# Patient Record
Sex: Female | Born: 1995 | Race: Black or African American | Hispanic: No | Marital: Single | Smoking: Never smoker
Health system: Southern US, Community
[De-identification: ages and names within clinical notes are randomized; demographics above are authoritative.]

## PROBLEM LIST (undated history)

## (undated) DIAGNOSIS — Z789 Other specified health status: Secondary | ICD-10-CM

## (undated) DIAGNOSIS — A599 Trichomoniasis, unspecified: Principal | ICD-10-CM

## (undated) DIAGNOSIS — Z3490 Encounter for supervision of normal pregnancy, unspecified, unspecified trimester: Secondary | ICD-10-CM

## (undated) HISTORY — DX: Trichomoniasis, unspecified: A59.9

## (undated) HISTORY — DX: Other specified health status: Z78.9

## (undated) HISTORY — PX: NO PAST SURGERIES: SHX2092

## (undated) HISTORY — DX: Encounter for supervision of normal pregnancy, unspecified, unspecified trimester: Z34.90

---
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
## 2014-05-08 NOTE — L&D Delivery Note (Signed)
Delivery Note At 4:18 AM a viable female was delivered via  (Presentation: LOA;  ).  APGAR: 8,9 ; weight  .   Placenta status: intact .  Cord: 3v,  with the following complications: none.    Anesthesia: Epidural  Episiotomy:   Lacerations:  None Suture Repair: none Est. Blood Loss (mL):    Mom to postpartum.  Baby to Couplet care / Skin to Skin.  Called to delivery. Mother pushed over 1 hour. Infant delivered to maternal abdomen. Cord clamped and cut. Active management of 3rd stage with traction and Pitocin. Placenta delivered intact with 3v cord. No tears. [id number]. Counts correct. Hemostatic.   Susan Jollyaleb G Melancon, MD Family Medicine - PGY 2  04/05/2015, 4:30 AM

## 2014-08-31 ENCOUNTER — Encounter (HOSPITAL_COMMUNITY): Payer: Self-pay | Admitting: Cardiology

## 2014-08-31 ENCOUNTER — Emergency Department (HOSPITAL_COMMUNITY)
Admission: EM | Admit: 2014-08-31 | Discharge: 2014-08-31 | Disposition: A | Discharge status: Home / Self Care | Payer: Self-pay | Attending: Emergency Medicine | Admitting: Emergency Medicine

## 2014-08-31 DIAGNOSIS — M5412 Radiculopathy, cervical region: Secondary | ICD-10-CM | POA: Insufficient documentation

## 2014-08-31 MED ORDER — IBUPROFEN 800 MG PO TABS: 800.0000 mg | ORAL_TABLET | Freq: Three times a day (TID) | ORAL | Status: DC

## 2014-08-31 MED ORDER — PREDNISONE 20 MG PO TABS
ORAL_TABLET | ORAL | Status: DC
Start: 2014-08-31 — End: 2014-11-17

## 2014-08-31 MED ORDER — HYDROCODONE-ACETAMINOPHEN 5-325 MG PO TABS: 2.0000 | ORAL_TABLET | ORAL | Status: DC | PRN

## 2014-08-31 NOTE — ED Provider Notes (Signed)
CSN: [id number]     Arrival date & time 08/31/14  4782 History  This chart was scribed for Gilda Crease, MD by Richarda Overlie, ED Scribe. This patient was seen in room APA05/APA05 and the patient's care was started 9:23 AM.     Chief Complaint  Patient presents with  . Shoulder Pain   The history is provided by the patient. No language interpreter was used.    HPI Comments: Susan Atkinson is a 18 y.o. female with no medical history who presents to the Emergency Department complaining of constant, left shoulder pain for the last 4 days. Pt also complains of left sided neck pain and states that the pain radiates all the way towards her left fingertips. She denies any new injuries. Pt states that she has had left shoulder problems in the past due to sports injuries but states she has not experienced the pain radiating down her left arm until her current episode. She states that she has been experiencing a tingling sensation in her left hand as well. She states that she has received x-rays in the past which have been negative. Pt states that certain movements worsens her pain. Pt reports no symptoms on the right side.    History reviewed. No pertinent past medical history. History reviewed. No pertinent past surgical history. History reviewed. No pertinent family history. History  Substance Use Topics  . Smoking status: Never Smoker   . Smokeless tobacco: Not on file  . Alcohol Use: No   OB History    No data available     Review of Systems  Musculoskeletal: Positive for arthralgias.  All other systems reviewed and are negative.   Allergies  Review of patient's allergies indicates no known allergies.  Home Medications   Prior to Admission medications   Medication Sig Start Date End Date Taking? Authorizing Provider  HYDROcodone-acetaminophen (NORCO/VICODIN) 5-325 MG per tablet Take 2 tablets by mouth every 4 (four) hours as needed for moderate pain. 08/31/14   Gilda Crease, MD  ibuprofen (ADVIL,MOTRIN) 800 MG tablet Take 1 tablet (800 mg total) by mouth 3 (three) times daily. 08/31/14   Gilda Crease, MD  predniSONE (DELTASONE) 20 MG tablet 3 tabs po daily x 3 days, then 2 tabs x 3 days, then 1.5 tabs x 3 days, then 1 tab x 3 days, then 0.5 tabs x 3 days 08/31/14   Gilda Crease, MD   BP 122/63 mmHg  Pulse 76  Temp(Src) 98.4 F (36.9 C) (Oral)  Resp 16  Ht  (1.778 m)  Wt 160 lb (72.576 kg)  BMI 22.96 kg/m2  SpO2 100%  LMP 08/23/2014 Physical Exam  Constitutional: She is oriented to person, place, and time. She appears well-developed and well-nourished. No distress.  HENT:  Head: Normocephalic and atraumatic.  Right Ear: Hearing normal.  Left Ear: Hearing normal.  Nose: Nose normal.  Mouth/Throat: Oropharynx is clear and moist and mucous membranes are normal.  Eyes: Conjunctivae and EOM are normal. Pupils are equal, round, and reactive to light.  Neck: Normal range of motion. Neck supple.  Cardiovascular: Regular rhythm, S1 normal and S2 normal.  Exam reveals no gallop and no friction rub.   No murmur heard. Pulmonary/Chest: Effort normal and breath sounds normal. No respiratory distress. She exhibits no tenderness.  Abdominal: Soft. Normal appearance and bowel sounds are normal. There is no hepatosplenomegaly. There is no tenderness. There is no rebound, no guarding, no tenderness at McBurney's point and  negative Murphy's sign. No hernia.  Musculoskeletal: Normal range of motion. She exhibits tenderness.  Left parapsinal cervial tenderness. Diffuse shoulder tenderness to biceps tendon and subacromial area.   Neurological: She is alert and oriented to person, place, and time. She has normal strength. No cranial nerve deficit or sensory deficit. Coordination normal. GCS eye subscore is 4. GCS verbal subscore is 5. GCS motor subscore is 6.  Normal strength and sensation. Normal extension, flexion, abduction, adduction and  opposition of fingers.   Skin: Skin is warm, dry and intact. No rash noted. No cyanosis.  Psychiatric: She has a normal mood and affect. Her speech is normal and behavior is normal. Thought content normal.  Nursing note and vitals reviewed.   ED Course  Procedures   DIAGNOSTIC STUDIES: Oxygen Saturation is 100% on RA, normal by my interpretation.    COORDINATION OF CARE: 9:28 AM Discussed treatment plan with pt at bedside and pt agreed to plan.   Labs Review Labs Reviewed - No data to display  Imaging Review No results found.   EKG Interpretation None      MDM   Final diagnoses:  Cervical radiculopathy   Resents to the ER for evaluation of pain in the left shoulder radiating down the arm. There is some pain in the left paraspinal area of the neck, but not midline. This could be cervical radiculopathy, versus tendinitis/bursitis. Treat with prednisone, rest, analgesia. Follow-up with orthopedics.  I personally performed the services described in this documentation, which was scribed in my presence. The recorded information has been reviewed and is accurate.       Gilda Creasehristopher J Pollina, MD 08/31/14 [phone]

## 2014-08-31 NOTE — ED Notes (Signed)
Pt c/o neck and shoulder pain. Pt states pain has become much worse over the past 3-4 days.

## 2014-08-31 NOTE — Discharge Instructions (Signed)

## 2014-11-17 ENCOUNTER — Ambulatory Visit (INDEPENDENT_AMBULATORY_CARE_PROVIDER_SITE_OTHER): Payer: Medicaid Other | Admitting: Adult Health

## 2014-11-17 ENCOUNTER — Encounter: Payer: Self-pay | Admitting: Adult Health

## 2014-11-17 VITALS — BP 130/62 | HR 104 | Ht 67.5 in | Wt 182.0 lb

## 2014-11-17 DIAGNOSIS — Z3009 Encounter for other general counseling and advice on contraception: Secondary | ICD-10-CM

## 2014-11-17 DIAGNOSIS — Z3490 Encounter for supervision of normal pregnancy, unspecified, unspecified trimester: Secondary | ICD-10-CM

## 2014-11-17 DIAGNOSIS — Z309 Encounter for contraceptive management, unspecified: Secondary | ICD-10-CM | POA: Diagnosis not present

## 2014-11-17 DIAGNOSIS — Z363 Encounter for antenatal screening for malformations: Secondary | ICD-10-CM

## 2014-11-17 DIAGNOSIS — Z01419 Encounter for gynecological examination (general) (routine) without abnormal findings: Secondary | ICD-10-CM

## 2014-11-17 DIAGNOSIS — Z3201 Encounter for pregnancy test, result positive: Secondary | ICD-10-CM

## 2014-11-17 HISTORY — DX: Encounter for supervision of normal pregnancy, unspecified, unspecified trimester: Z34.90

## 2014-11-17 LAB — POCT URINE PREGNANCY: Preg Test, Ur: POSITIVE — AB

## 2014-11-17 MED ORDER — PRENATAL PLUS 27-1 MG PO TABS: 1.0000 | ORAL_TABLET | Freq: Every day | ORAL | Status: DC

## 2014-11-17 NOTE — Progress Notes (Signed)
Patient ID: Susan Atkinson, female   DOB: 10-Aug-1995, 19 y.o.   MRN: [id number] History of Present Illness: Susan Atkinson is a 19 year old black female in for well woman gyn exam, she has family planning medicaid, and had missed several periods, so will get UPT now and it is +.Has not felt movements, but feels tight knots at times.   Current Medications, Allergies, Past Medical History, Past Surgical History, Family History and Social History were reviewed in [name] electronic medical record.     Review of Systems: Patient denies any headaches, hearing loss, fatigue, blurred vision, shortness of breath, chest pain, abdominal pain, problems with bowel movements, urination, or intercourse. No joint pain or mood swings.+missed period.    Physical Exam:BP 130/62 mmHg  Pulse 104  Ht 5' 7.5" (1.715 m)  Wt 182 lb (82.555 kg)  BMI 28.07 kg/m2  LMP 08/04/2014 UPT+ about 15 weeks by LMP, EDD 05/11/15 but by exam about 20-21 weeks. General:  Well developed, well nourished, no acute distress Skin:  Warm and dry Neck:  Midline trachea, normal thyroid, good ROM, no lymphadenopathy Lungs; Clear to auscultation bilaterally Breast:  No dominant palpable mass, retraction, or nipple discharge Cardiovascular: Regular rate and rhythm Abdomen:  Soft, non tender, no hepatosplenomegaly, FH U+1, Has FHR 164 by doppler Pelvic:  External genitalia is normal in appearance, no lesions.  The vagina is normal in appearance. Urethra has no lesions or masses. The cervix is smooth and closed, GC/CHL obtained.  Uterus is felt to be about 20-21 weeks in size..  No adnexal masses or tenderness noted.Bladder is non tender, no masses felt. Extremities/musculoskeletal:  No swelling or varicosities noted, no clubbing or cyanosis Psych:  No mood changes, alert and cooperative,seems happy   Impression: Well woman gyn exam no pap, has Family planning medicaid Pregnant    Plan: GC/CHL sent Check HIV,RPR and HSV2 Rx  prenatal plus #30 take 1 daily with 11 refills Return in 1 week for anatomy scan and new OB visit Review handout on second trimester, new OB packet given  Confirmation form given, go get pregnancy medicaid

## 2014-11-17 NOTE — Patient Instructions (Signed)
Second Trimester of Pregnancy The second trimester is from week 13 through week 28, months 4 through 6. The second trimester is often a time when you feel your best. Your body has also adjusted to being pregnant, and you begin to feel better physically. Usually, morning sickness has lessened or quit completely, you may have more energy, and you may have an increase in appetite. The second trimester is also a time when the fetus is growing rapidly. At the end of the sixth month, the fetus is about 9 inches long and weighs about 1 pounds. You will likely begin to feel the baby move (quickening) between 18 and 20 weeks of the pregnancy. BODY CHANGES Your body goes through many changes during pregnancy. The changes vary from woman to woman.   Your weight will continue to increase. You will notice your lower abdomen bulging out.  You may begin to get stretch marks on your hips, abdomen, and breasts.  You may develop headaches that can be relieved by medicines approved by your health care provider.  You may urinate more often because the fetus is pressing on your bladder.  You may develop or continue to have heartburn as a result of your pregnancy.  You may develop constipation because certain hormones are causing the muscles that push waste through your intestines to slow down.  You may develop hemorrhoids or swollen, bulging veins (varicose veins).  You may have back pain because of the weight gain and pregnancy hormones relaxing your joints between the bones in your pelvis and as a result of a shift in weight and the muscles that support your balance.  Your breasts will continue to grow and be tender.  Your gums may bleed and may be sensitive to brushing and flossing.  Dark spots or blotches (chloasma, mask of pregnancy) may develop on your face. This will likely fade after the baby is born.  A dark line from your belly button to the pubic area (linea nigra) may appear. This will likely fade  after the baby is born.  You may have changes in your hair. These can include thickening of your hair, rapid growth, and changes in texture. Some women also have hair loss during or after pregnancy, or hair that feels dry or thin. Your hair will most likely return to normal after your baby is born. WHAT TO EXPECT AT YOUR PRENATAL VISITS During a routine prenatal visit:  You will be weighed to make sure you and the fetus are growing normally.  Your blood pressure will be taken.  Your abdomen will be measured to track your baby's growth.  The fetal heartbeat will be listened to.  Any test results from the previous visit will be discussed. Your health care provider may ask you:  How you are feeling.  If you are feeling the baby move.  If you have had any abnormal symptoms, such as leaking fluid, bleeding, severe headaches, or abdominal cramping.  If you have any questions. Other tests that may be performed during your second trimester include:  Blood tests that check for:  Low iron levels (anemia).  Gestational diabetes (between 24 and 28 weeks).  Rh antibodies.  Urine tests to check for infections, diabetes, or protein in the urine.  An ultrasound to confirm the proper growth and development of the baby.  An amniocentesis to check for possible genetic problems.  Fetal screens for spina bifida and Down syndrome. HOME CARE INSTRUCTIONS   Avoid all smoking, herbs, alcohol, and unprescribed   drugs. These chemicals affect the formation and growth of the baby.  Follow your health care provider's instructions regarding medicine use. There are medicines that are either safe or unsafe to take during pregnancy.  Exercise only as directed by your health care provider. Experiencing uterine cramps is a good sign to stop exercising.  Continue to eat regular, healthy meals.  Wear a good support bra for breast tenderness.  Do not use hot tubs, steam rooms, or saunas.  Wear your  seat belt at all times when driving.  Avoid raw meat, uncooked cheese, cat litter boxes, and soil used by cats. These carry germs that can cause birth defects in the baby.  Take your prenatal vitamins.  Try taking a stool softener (if your health care provider approves) if you develop constipation. Eat more high-fiber foods, such as fresh vegetables or fruit and whole grains. Drink plenty of fluids to keep your urine clear or pale yellow.  Take warm sitz baths to soothe any pain or discomfort caused by hemorrhoids. Use hemorrhoid cream if your health care provider approves.  If you develop varicose veins, wear support hose. Elevate your feet for 15 minutes, 3-4 times a day. Limit salt in your diet.  Avoid heavy lifting, wear low heel shoes, and practice good posture.  Rest with your legs elevated if you have leg cramps or low back pain.  Visit your dentist if you have not gone yet during your pregnancy. Use a soft toothbrush to brush your teeth and be gentle when you floss.  A sexual relationship may be continued unless your health care provider directs you otherwise.  Continue to go to all your prenatal visits as directed by your health care provider. SEEK MEDICAL CARE IF:   You have dizziness.  You have mild pelvic cramps, pelvic pressure, or nagging pain in the abdominal area.  You have persistent nausea, vomiting, or diarrhea.  You have a bad smelling vaginal discharge.  You have pain with urination. SEEK IMMEDIATE MEDICAL CARE IF:   You have a fever.  You are leaking fluid from your vagina.  You have spotting or bleeding from your vagina.  You have severe abdominal cramping or pain.  You have rapid weight gain or loss.  You have shortness of breath with chest pain.  You notice sudden or extreme swelling of your face, hands, ankles, feet, or legs.  You have not felt your baby move in over an hour.  You have severe headaches that do not go away with  medicine.  You have vision changes. Document Released: 04/18/2001 Document Revised: 04/29/2013 Document Reviewed: 06/25/2012 [facility] Patient Information 2015 [place]. This information is not intended to replace advice given to you by your health care provider. Make sure you discuss any questions you have with your health care provider. Return in 1 week for [place]

## 2014-11-19 LAB — HIV ANTIBODY (ROUTINE TESTING W REFLEX): HIV Screen 4th Generation wRfx: NONREACTIVE

## 2014-11-19 LAB — HSV 2 ANTIBODY, IGG: HSV 2 Glycoprotein G Ab, IgG: <0.91 index (ref 0.00–0.90)

## 2014-11-19 LAB — GC/CHLAMYDIA PROBE AMP
Chlamydia trachomatis, NAA: NEGATIVE
NEISSERIA GONORRHOEAE BY PCR: NEGATIVE

## 2014-11-19 LAB — RPR: RPR Ser Ql: NONREACTIVE

## 2014-11-26 ENCOUNTER — Encounter: Payer: Self-pay | Admitting: Advanced Practice Midwife

## 2014-11-26 ENCOUNTER — Ambulatory Visit (INDEPENDENT_AMBULATORY_CARE_PROVIDER_SITE_OTHER): Payer: Medicaid Other | Admitting: Advanced Practice Midwife

## 2014-11-26 VITALS — BP 118/64 | HR 87 | Wt 184.0 lb

## 2014-11-26 DIAGNOSIS — O0932 Supervision of pregnancy with insufficient antenatal care, second trimester: Secondary | ICD-10-CM | POA: Diagnosis not present

## 2014-11-26 DIAGNOSIS — Z0283 Encounter for blood-alcohol and blood-drug test: Secondary | ICD-10-CM

## 2014-11-26 DIAGNOSIS — Z1389 Encounter for screening for other disorder: Secondary | ICD-10-CM

## 2014-11-26 DIAGNOSIS — Z3402 Encounter for supervision of normal first pregnancy, second trimester: Secondary | ICD-10-CM | POA: Diagnosis not present

## 2014-11-26 DIAGNOSIS — Z3490 Encounter for supervision of normal pregnancy, unspecified, unspecified trimester: Secondary | ICD-10-CM

## 2014-11-26 DIAGNOSIS — Z331 Pregnant state, incidental: Secondary | ICD-10-CM

## 2014-11-26 DIAGNOSIS — Z369 Encounter for antenatal screening, unspecified: Secondary | ICD-10-CM

## 2014-11-26 NOTE — Patient Instructions (Signed)
1. Before your test, do not eat or drink anything for 8-10 hours prior to your  appointment (a small amount of water is allowed and you may take any medicines you normally take). Be sure to drink lots of water the day before. 2. When you arrive, your blood will be drawn for a 'fasting' blood sugar level.  Then you will be given a sweetened carbonated beverage to drink. You should  complete drinking this beverage within five minutes. After finishing the  beverage, you will have your blood drawn exactly 1 and 2 hours later. Having  your blood drawn on time is an important part of this test. A total of three blood  samples will be done. 3. The test takes approximately 2  hours. During the test, do not have anything to  eat or drink. Do not smoke, chew gum (not even sugarless gum) or use breath mints.  4. During the test you should remain close by and seated as much as possible and  avoid walking around. You may want to bring a book or something else to  occupy your time.  5. After your test, you may eat and drink as normal. You may want to bring a snack  to eat after the test is finished. Your provider will advise you as to the results of  this test and any follow-up if necessary  You will also be retested for syphilis, HIV and blood levels (anemia):  You were already tested in the first trimester, but [facility] recommends retesting.  Additionally, you will be tested for Type 2 Herpes. MOST people do not know that they have genital herpes, as only around 15% of people have outbreaks.  However, it is still transmittable to other people, including the baby (but only during the birth).  If you test positive for Type 2 Herpes, we place you on a medicine called acyclovir the last 6 weeks of your pregnancy to prevent transmission of the virus to the baby during the birth.    If your sugar test is positive for gestational diabetes, you will be given an phone call and further instructions discussed.   We typically do not call patients with positive herpes results, but will discuss it at your next appointment.  If you wish to know all of your test results before your next appointment, feel free to call the office, or look up your test results on Mychart.  (The range that the lab uses for normal values of the sugar test are not necessarily the range that is used for pregnant women; if your results are within the range, they are definitely normal.  However, if a value is deemed "high" by the lab, it may not be too high for a pregnant woman.  We will need to discuss the normal range if your value(s) fall in the "high" category).     Tdap Vaccine  It is recommended that you get the Tdap vaccine during the third trimester of EACH pregnancy to help protect your baby from getting pertussis (whooping cough)  27-36 weeks is the BEST time to do this so that you can pass the protection on to your baby. During pregnancy is better than after pregnancy, but if you are unable to get it during pregnancy it will be offered at the hospital.  You can get this vaccine at the health department or your family doctor, as well as some pharmacies.  Everyone who will be around your baby should also be up-to-date on   their vaccines. Adults (who are not pregnant) only need 1 dose of Tdap during adulthood.     Second Trimester of Pregnancy The second trimester is from week 13 through week 28, months 4 through 6. The second trimester is often a time when you feel your best. Your body has also adjusted to being pregnant, and you begin to feel better physically. Usually, morning sickness has lessened or quit completely, you may have more energy, and you may have an increase in appetite. The second trimester is also a time when the fetus is growing rapidly. At the end of the sixth month, the fetus is about 9 inches long and weighs about 1 pounds. You will likely begin to feel the baby move (quickening) between 18 and 20 weeks of  the pregnancy. BODY CHANGES Your body goes through many changes during pregnancy. The changes vary from woman to woman.   Your weight will continue to increase. You will notice your lower abdomen bulging out.  You may begin to get stretch marks on your hips, abdomen, and breasts.  You may develop headaches that can be relieved by medicines approved by your health care provider.  You may urinate more often because the fetus is pressing on your bladder.  You may develop or continue to have heartburn as a result of your pregnancy.  You may develop constipation because certain hormones are causing the muscles that push waste through your intestines to slow down.  You may develop hemorrhoids or swollen, bulging veins (varicose veins).  You may have back pain because of the weight gain and pregnancy hormones relaxing your joints between the bones in your pelvis and as a result of a shift in weight and the muscles that support your balance.  Your breasts will continue to grow and be tender.  Your gums may bleed and may be sensitive to brushing and flossing.  Dark spots or blotches (chloasma, mask of pregnancy) may develop on your face. This will likely fade after the baby is born.  A dark line from your belly button to the pubic area (linea nigra) may appear. This will likely fade after the baby is born.  You may have changes in your hair. These can include thickening of your hair, rapid growth, and changes in texture. Some women also have hair loss during or after pregnancy, or hair that feels dry or thin. Your hair will most likely return to normal after your baby is born. WHAT TO EXPECT AT YOUR PRENATAL VISITS During a routine prenatal visit:  You will be weighed to make sure you and the fetus are growing normally.  Your blood pressure will be taken.  Your abdomen will be measured to track your baby's growth.  The fetal heartbeat will be listened to.  Any test results from the  previous visit will be discussed. Your health care provider may ask you:  How you are feeling.  If you are feeling the baby move.  If you have had any abnormal symptoms, such as leaking fluid, bleeding, severe headaches, or abdominal cramping.  If you have any questions. Other tests that may be performed during your second trimester include:  Blood tests that check for:  Low iron levels (anemia).  Gestational diabetes (between 24 and 28 weeks).  Rh antibodies.  Urine tests to check for infections, diabetes, or protein in the urine.  An ultrasound to confirm the proper growth and development of the baby.  An amniocentesis to check for possible genetic problems.  Fetal screens for spina bifida and Down syndrome. HOME CARE INSTRUCTIONS   Avoid all smoking, herbs, alcohol, and unprescribed drugs. These chemicals affect the formation and growth of the baby.  Follow your health care provider's instructions regarding medicine use. There are medicines that are either safe or unsafe to take during pregnancy.  Exercise only as directed by your health care provider. Experiencing uterine cramps is a good sign to stop exercising.  Continue to eat regular, healthy meals.  Wear a good support bra for breast tenderness.  Do not use hot tubs, steam rooms, or saunas.  Wear your seat belt at all times when driving.  Avoid raw meat, uncooked cheese, cat litter boxes, and soil used by cats. These carry germs that can cause birth defects in the baby.  Take your prenatal vitamins.  Try taking a stool softener (if your health care provider approves) if you develop constipation. Eat more high-fiber foods, such as fresh vegetables or fruit and whole grains. Drink plenty of fluids to keep your urine clear or pale yellow.  Take warm sitz baths to soothe any pain or discomfort caused by hemorrhoids. Use hemorrhoid cream if your health care provider approves.  If you develop varicose veins, wear  support hose. Elevate your feet for 15 minutes, 3-4 times a day. Limit salt in your diet.  Avoid heavy lifting, wear low heel shoes, and practice good posture.  Rest with your legs elevated if you have leg cramps or low back pain.  Visit your dentist if you have not gone yet during your pregnancy. Use a soft toothbrush to brush your teeth and be gentle when you floss.  A sexual relationship may be continued unless your health care provider directs you otherwise.  Continue to go to all your prenatal visits as directed by your health care provider. SEEK MEDICAL CARE IF:   You have dizziness.  You have mild pelvic cramps, pelvic pressure, or nagging pain in the abdominal area.  You have persistent nausea, vomiting, or diarrhea.  You have a bad smelling vaginal discharge.  You have pain with urination. SEEK IMMEDIATE MEDICAL CARE IF:   You have a fever.  You are leaking fluid from your vagina.  You have spotting or bleeding from your vagina.  You have severe abdominal cramping or pain.  You have rapid weight gain or loss.  You have shortness of breath with chest pain.  You notice sudden or extreme swelling of your face, hands, ankles, feet, or legs.  You have not felt your baby move in over an hour.  You have severe headaches that do not go away with medicine.  You have vision changes. Document Released: 04/18/2001 Document Revised: 04/29/2013 Document Reviewed: 06/25/2012 [facility] Patient Information 2015 [place], [place]. This information is not intended to replace advice given to you by your health care provider. Make sure you discuss any questions you have with your health care provider.

## 2014-11-26 NOTE — Progress Notes (Signed)
   Subjective:    Susan Atkinson is a G1P0 Unknown being seen today for her first obstetrical visit. She is 16 weeks by "sure LMP", dating [place] scheduled for tomorrow.   Patient reports no complaints.  Filed Vitals:   11/26/14 1142  BP: 118/64  Pulse: 87  Weight: 184 lb (83.462 kg)    HISTORY: OB History  Gravida Para Term Preterm AB SAB TAB Ectopic Multiple Living  1             # Outcome Date GA Lbr Len/2nd Weight Sex Delivery Anes PTL Lv  1 Current              Past Medical History  Diagnosis Date  . Pregnant 11/17/2014   History reviewed. No pertinent past surgical history. History reviewed. No pertinent family history.   Exam                       Uterus Normal, Gravid, FH: 26                System:     Skin: normal coloration and turgor, no rashes    Neurologic: oriented, normal, normal mood   Extremities: normal strength, tone, and muscle mass   HEENT PERRLA   Mouth/Teeth mucous membranes moist, normal dentition   Neck supple and no masses   Cardiovascular: regular rate and rhythm   Respiratory:  appears well, vitals normal, no respiratory distress, acyanotic   Abdomen: soft, non-tender;  FHR: 150          Assessment:    Pregnancy: G1P0 Patient Active Problem List   Diagnosis Date Noted  . Pregnant 11/17/2014        Plan:     Initial labs:  Ordered, but will wait until tomorrow in case genetic screening can be drawn Continue prenatal vitamins  Problem list reviewed and updated  Reviewed recommended weight gain based on pre-gravid BMI  Encouraged well-balanced diet Genetic Screening discussed Quad Screen: requested.  Ultrasound discussed; fetal survey: requested.  Follow up tomorrow for anatomy/dating [place].  Assuming that she is ~26 weeks, will f/u 3 weeks for PN2.  Marland Kitchen  CRESENZO-DISHMAN,Charrisse Masley 11/26/2014

## 2014-11-27 ENCOUNTER — Ambulatory Visit (INDEPENDENT_AMBULATORY_CARE_PROVIDER_SITE_OTHER): Payer: Medicaid Other

## 2014-11-27 DIAGNOSIS — Z363 Encounter for antenatal screening for malformations: Secondary | ICD-10-CM

## 2014-11-27 DIAGNOSIS — Z36 Encounter for antenatal screening of mother: Secondary | ICD-10-CM

## 2014-11-27 NOTE — Progress Notes (Signed)
[place] 21+6wks,efw 467g,normal ov's bilat,sdp of fluid 6.1cm,post pl gr 0,cx 3.6cm,breech,fht 145bpm,anatomy complete w/no obvious abn seen,

## 2014-11-28 LAB — GC/CHLAMYDIA PROBE AMP
Chlamydia trachomatis, NAA: NEGATIVE
Neisseria gonorrhoeae by PCR: NEGATIVE

## 2014-11-28 LAB — URINE CULTURE: ORGANISM ID, BACTERIA: NO GROWTH

## 2014-11-30 LAB — MICROSCOPIC EXAMINATION: Casts: NONE SEEN /lpf

## 2014-11-30 LAB — HEPATITIS B SURFACE ANTIGEN: Hepatitis B Surface Ag: NEGATIVE

## 2014-11-30 LAB — CBC
HEMOGLOBIN: 12.2 g/dL (ref 11.1–15.9)
Hematocrit: 35 % (ref 34.0–46.6)
MCH: 30.4 pg (ref 26.6–33.0)
MCHC: 34.9 g/dL (ref 31.5–35.7)
MCV: 87 fL (ref 79–97)
PLATELETS: 198 10*3/uL (ref 150–379)
RBC: 4.01 x10E6/uL (ref 3.77–5.28)
RDW: 13.3 % (ref 12.3–15.4)
WBC: 9.6 10*3/uL (ref 3.4–10.8)

## 2014-11-30 LAB — VARICELLA ZOSTER ANTIBODY, IGG: Varicella zoster IgG: 1750 index (ref >165)

## 2014-11-30 LAB — URINALYSIS, ROUTINE W REFLEX MICROSCOPIC
Bilirubin, UA: NEGATIVE
GLUCOSE, UA: NEGATIVE
Ketones, UA: NEGATIVE
NITRITE UA: NEGATIVE
Protein, UA: NEGATIVE
RBC, UA: NEGATIVE
SPEC GRAV UA: 1.009 (ref 1.005–1.030)
Urobilinogen, Ur: 0.2 mg/dL (ref 0.2–1.0)
pH, UA: 8 — ABNORMAL HIGH (ref 5.0–7.5)

## 2014-11-30 LAB — PMP SCREEN PROFILE (10S), URINE
Amphetamine Screen, Ur: NEGATIVE ng/mL
BENZODIAZEPINE SCREEN, URINE: NEGATIVE ng/mL
Barbiturate Screen, Ur: NEGATIVE ng/mL
CANNABINOIDS UR QL SCN: NEGATIVE ng/mL
COCAINE(METAB.) SCREEN, URINE: NEGATIVE ng/mL
Creatinine(Crt), U: 20.6 mg/dL (ref 20.0–300.0)
Methadone Scn, Ur: NEGATIVE ng/mL
Opiate Scrn, Ur: NEGATIVE ng/mL
Oxycodone+Oxymorphone Ur Ql Scn: NEGATIVE ng/mL
PCP Scrn, Ur: NEGATIVE ng/mL
Ph of Urine: 7.9 (ref 4.5–8.9)
Propoxyphene, Screen: NEGATIVE ng/mL

## 2014-11-30 LAB — ABO/RH: Rh Factor: POSITIVE

## 2014-11-30 LAB — HIV ANTIBODY (ROUTINE TESTING W REFLEX): HIV SCREEN 4TH GENERATION: NONREACTIVE

## 2014-11-30 LAB — ANTIBODY SCREEN: Antibody Screen: NEGATIVE

## 2014-11-30 LAB — RPR: RPR: NONREACTIVE

## 2014-11-30 LAB — SICKLE CELL SCREEN: Sickle Cell Screen: NEGATIVE

## 2014-11-30 LAB — RUBELLA SCREEN: RUBELLA: 5.76 {index} (ref >0.99)

## 2014-12-04 LAB — CYSTIC FIBROSIS MUTATION 97: Interpretation: NOT DETECTED

## 2014-12-17 ENCOUNTER — Encounter: Payer: Self-pay | Admitting: Obstetrics and Gynecology

## 2014-12-17 ENCOUNTER — Other Ambulatory Visit: Payer: Medicaid Other

## 2014-12-17 ENCOUNTER — Ambulatory Visit (INDEPENDENT_AMBULATORY_CARE_PROVIDER_SITE_OTHER): Payer: Medicaid Other | Admitting: Obstetrics and Gynecology

## 2014-12-17 VITALS — BP 120/80 | HR 84 | Wt 187.0 lb

## 2014-12-17 DIAGNOSIS — Z1389 Encounter for screening for other disorder: Secondary | ICD-10-CM

## 2014-12-17 DIAGNOSIS — Z331 Pregnant state, incidental: Secondary | ICD-10-CM

## 2014-12-17 DIAGNOSIS — Z3402 Encounter for supervision of normal first pregnancy, second trimester: Secondary | ICD-10-CM

## 2014-12-17 LAB — POCT URINALYSIS DIPSTICK
Glucose, UA: NEGATIVE
Ketones, UA: NEGATIVE
Leukocytes, UA: NEGATIVE
NITRITE UA: NEGATIVE
Protein, UA: NEGATIVE
RBC UA: NEGATIVE

## 2014-12-17 NOTE — Progress Notes (Signed)
Pt denies any problems or concerns at this time.  

## 2014-12-17 NOTE — Progress Notes (Signed)
Patient ID: [name], female   DOB: 1995/09/14, 19 y.o.   MRN: [id number]  G1P0 [redacted]w[redacted]d Estimated Date of Delivery: 04/03/15  Blood pressure 120/80, pulse 84, weight 187 lb (84.823 kg), last menstrual period 08/04/2014.   refer to the ob flow sheet for FH and FHR, also BP, Wt, Urine results:notable for negative  Patient reports  + good fetal movement, denies any bleeding and no rupture of membranes symptoms or regular contractions. Patient complaints:Pt denies any complaints at this time.  FH- 27 cm FHR-156 bpm  Questions were answered. Assessment: [redacted]w[redacted]d, G1P0  Plan:  Continued routine obstetrical care.  F/u in 4 weeks for routine pre-natal care, glucose tolerance test pn2 labs   This chart was scribed for Tilda Burrow, MD by Gwenyth Ober, Medical Scribe. This patient was seen in room 4 and the patient's care was started at 11:19 AM.   I personally performed the services described in this documentation, which was SCRIBED in my presence. The recorded information has been reviewed and considered accurate. It has been edited as necessary during review. Tilda Burrow, MD

## 2015-01-14 ENCOUNTER — Ambulatory Visit (INDEPENDENT_AMBULATORY_CARE_PROVIDER_SITE_OTHER): Payer: Medicaid Other | Admitting: Advanced Practice Midwife

## 2015-01-14 ENCOUNTER — Other Ambulatory Visit: Payer: Medicaid Other

## 2015-01-14 ENCOUNTER — Encounter: Payer: Self-pay | Admitting: Advanced Practice Midwife

## 2015-01-14 VITALS — BP 116/76 | HR 84 | Wt 193.0 lb

## 2015-01-14 DIAGNOSIS — Z369 Encounter for antenatal screening, unspecified: Secondary | ICD-10-CM

## 2015-01-14 DIAGNOSIS — Z331 Pregnant state, incidental: Secondary | ICD-10-CM

## 2015-01-14 DIAGNOSIS — Z1389 Encounter for screening for other disorder: Secondary | ICD-10-CM

## 2015-01-14 DIAGNOSIS — Z131 Encounter for screening for diabetes mellitus: Secondary | ICD-10-CM

## 2015-01-14 DIAGNOSIS — Z3403 Encounter for supervision of normal first pregnancy, third trimester: Secondary | ICD-10-CM

## 2015-01-14 LAB — POCT URINALYSIS DIPSTICK
Glucose, UA: NEGATIVE
Glucose, UA: NEGATIVE
KETONES UA: NEGATIVE
Leukocytes, UA: NEGATIVE
Nitrite, UA: NEGATIVE
PROTEIN UA: NEGATIVE
RBC UA: NEGATIVE

## 2015-01-14 NOTE — Progress Notes (Signed)
G1P0 [redacted]w[redacted]d Estimated Date of Delivery: 04/03/15  Blood pressure 116/76, pulse 84, weight 193 lb (87.544 kg), last menstrual period 08/04/2014.   BP weight and urine results all reviewed and noted.  Please refer to the obstetrical flow sheet for the fundal height and fetal heart rate documentation:  Patient reports good fetal movement, denies any bleeding and no rupture of membranes symptoms or regular contractions. Patient is without complaints. All questions were answered.  Orders Placed This Encounter  Procedures  . POCT urinalysis dipstick    Plan:  Continued routine obstetrical care, PN2 today   Return in about 3 weeks (around 02/04/2015) for LROB.

## 2015-01-14 NOTE — Progress Notes (Signed)
Pt denies any problems or concerns at this time.  

## 2015-01-15 LAB — RPR: RPR Ser Ql: NONREACTIVE

## 2015-01-15 LAB — CBC
HEMATOCRIT: 35.9 % (ref 34.0–46.6)
Hemoglobin: 12.3 g/dL (ref 11.1–15.9)
MCH: 30.2 pg (ref 26.6–33.0)
MCHC: 34.3 g/dL (ref 31.5–35.7)
MCV: 88 fL (ref 79–97)
PLATELETS: 185 10*3/uL (ref 150–379)
RBC: 4.07 x10E6/uL (ref 3.77–5.28)
RDW: 12.7 % (ref 12.3–15.4)
WBC: 8 10*3/uL (ref 3.4–10.8)

## 2015-01-15 LAB — GLUCOSE TOLERANCE, 2 HOURS W/ 1HR
GLUCOSE, FASTING: 79 mg/dL (ref 65–91)
Glucose, 1 hour: 100 mg/dL (ref 65–179)
Glucose, 2 hour: 68 mg/dL (ref 65–152)

## 2015-01-15 LAB — ANTIBODY SCREEN: ANTIBODY SCREEN: NEGATIVE

## 2015-01-15 LAB — HSV 2 ANTIBODY, IGG

## 2015-01-15 LAB — HIV ANTIBODY (ROUTINE TESTING W REFLEX): HIV Screen 4th Generation wRfx: NONREACTIVE

## 2015-02-04 ENCOUNTER — Encounter: Payer: Self-pay | Admitting: Advanced Practice Midwife

## 2015-02-04 ENCOUNTER — Ambulatory Visit (INDEPENDENT_AMBULATORY_CARE_PROVIDER_SITE_OTHER): Payer: Medicaid Other | Admitting: Advanced Practice Midwife

## 2015-02-04 VITALS — BP 120/76 | HR 96 | Wt 196.0 lb

## 2015-02-04 DIAGNOSIS — O0932 Supervision of pregnancy with insufficient antenatal care, second trimester: Secondary | ICD-10-CM

## 2015-02-04 DIAGNOSIS — Z331 Pregnant state, incidental: Secondary | ICD-10-CM

## 2015-02-04 DIAGNOSIS — Z1389 Encounter for screening for other disorder: Secondary | ICD-10-CM

## 2015-02-04 DIAGNOSIS — Z3493 Encounter for supervision of normal pregnancy, unspecified, third trimester: Secondary | ICD-10-CM

## 2015-02-04 DIAGNOSIS — Z23 Encounter for immunization: Secondary | ICD-10-CM | POA: Diagnosis not present

## 2015-02-04 LAB — POCT URINALYSIS DIPSTICK
Blood, UA: 1
GLUCOSE UA: NEGATIVE
KETONES UA: NEGATIVE
LEUKOCYTES UA: NEGATIVE
Nitrite, UA: NEGATIVE

## 2015-02-04 NOTE — Progress Notes (Signed)
G1P0 [redacted]w[redacted]d Estimated Date of Delivery: 04/03/15  Last menstrual period 08/04/2014.   BP weight and urine results all reviewed and noted.  Please refer to the obstetrical flow sheet for the fundal height and fetal heart rate documentation:  Patient reports good fetal movement, denies any bleeding and no rupture of membranes symptoms or regular contractions. Patient is without complaints. All questions were answered.  No orders of the defined types were placed in this encounter.    Plan:  Continued routine obstetrical care,   Return in about 2 weeks (around 02/18/2015) for LROB.

## 2015-02-04 NOTE — Progress Notes (Signed)
Pt denies any problems or concerns at this time.  

## 2015-02-18 ENCOUNTER — Encounter: Payer: Self-pay | Admitting: Advanced Practice Midwife

## 2015-02-18 ENCOUNTER — Ambulatory Visit (INDEPENDENT_AMBULATORY_CARE_PROVIDER_SITE_OTHER): Payer: Medicaid Other | Admitting: Advanced Practice Midwife

## 2015-02-18 VITALS — BP 122/80 | HR 88 | Wt 201.0 lb

## 2015-02-18 DIAGNOSIS — Z331 Pregnant state, incidental: Secondary | ICD-10-CM

## 2015-02-18 DIAGNOSIS — Z3493 Encounter for supervision of normal pregnancy, unspecified, third trimester: Secondary | ICD-10-CM

## 2015-02-18 DIAGNOSIS — Z832 Family history of diseases of the blood and blood-forming organs and certain disorders involving the immune mechanism: Secondary | ICD-10-CM

## 2015-02-18 DIAGNOSIS — Z1389 Encounter for screening for other disorder: Secondary | ICD-10-CM

## 2015-02-18 LAB — POCT URINALYSIS DIPSTICK
Blood, UA: NEGATIVE
GLUCOSE UA: NEGATIVE
Ketones, UA: NEGATIVE
Leukocytes, UA: NEGATIVE
NITRITE UA: NEGATIVE

## 2015-02-18 NOTE — Patient Instructions (Signed)

## 2015-02-18 NOTE — Progress Notes (Signed)
G1P0 158w5d Estimated Date of Delivery: 04/03/15  Blood pressure 122/80, pulse 88, weight 201 lb (91.173 kg), last menstrual period 08/04/2014.   BP weight and urine results all reviewed and noted.  Please refer to the obstetrical flow sheet for the fundal height and fetal heart rate documentation:  Patient reports good fetal movement, denies any bleeding and no rupture of membranes symptoms or regular contractions. Patient is without complaints. All questions were answered.  Orders Placed This Encounter  Procedures  . POCT urinalysis dipstick    Plan:  Continued routine obstetrical care,   Return in about 2 weeks (around 03/04/2015) for LROB.

## 2015-02-18 NOTE — Progress Notes (Signed)
Pt denies any problems or concerns at this time.  

## 2015-02-25 ENCOUNTER — Encounter: Payer: Self-pay | Admitting: Obstetrics and Gynecology

## 2015-02-25 ENCOUNTER — Ambulatory Visit (INDEPENDENT_AMBULATORY_CARE_PROVIDER_SITE_OTHER): Payer: Medicaid Other | Admitting: Obstetrics and Gynecology

## 2015-02-25 ENCOUNTER — Telehealth: Payer: Self-pay | Admitting: Advanced Practice Midwife

## 2015-02-25 VITALS — BP 120/80 | HR 80 | Wt 199.0 lb

## 2015-02-25 DIAGNOSIS — Z331 Pregnant state, incidental: Secondary | ICD-10-CM

## 2015-02-25 DIAGNOSIS — Z1389 Encounter for screening for other disorder: Secondary | ICD-10-CM

## 2015-02-25 DIAGNOSIS — Z3493 Encounter for supervision of normal pregnancy, unspecified, third trimester: Secondary | ICD-10-CM

## 2015-02-25 LAB — POCT URINALYSIS DIPSTICK
GLUCOSE UA: NEGATIVE
Ketones, UA: NEGATIVE
LEUKOCYTES UA: NEGATIVE
NITRITE UA: NEGATIVE
RBC UA: NEGATIVE

## 2015-02-25 NOTE — Telephone Encounter (Signed)
Pt c/o left arm tingling and burning sensation, swelling left hand. Pt given an appt for today for evaluation.

## 2015-02-25 NOTE — Progress Notes (Signed)
Patient ID: Susan Atkinson, female   DOB: [birth year], 19 y.o.   MRN: [id number] WORK-IN APPOINTMENT G1P0 4337w5d Estimated Date of Delivery: 04/03/15  Blood pressure 120/80, pulse 80, weight 199 lb (90.266 kg), last menstrual period 08/04/2014.   refer to the ob flow sheet for FH and FHR, also BP, Wt, Urine results:notable for trace protein, otherwise negative  Patient reports  + good fetal movement, denies any bleeding and no rupture of membranes symptoms or regular contractions. Patient complaints: Patient WORKED IN for tingling, burning right arm pain that began last night. Pt states that it has improved somewhat, but the pain sets in again when she lays down. This is a problem. She denies headache or blurred vision.  FHR: 138 bpm FH: 36 cm  Questions were answered. Assessment: Pregnancy 6537w5d, LROB   Carpal tunnel syndrome, otherwise normal pregnancy   Encouraged patient and [facility]OB [facility].    Plan:  Continued routine obstetrical care,   Position changes discussed for bedtime - body pillow, etc.  F/u in 2 weeks for pnx care    By signing my name below, I, Susan Atkinson, attest that this documentation has been prepared under the direction and in the presence of Susan BurrowJohn Hser Belanger V, MD. Electronically Signed: Ronney LionSuzanne Atkinson, ED Atkinson. 02/25/2015. 3:00 PM.   I personally performed the services described in this documentation, which was SCRIBED in my presence. The recorded information has been reviewed and considered accurate. It has been edited as necessary during review. Susan BurrowFERGUSON,Susan Wojtas V, MD  (Atkinson attestation statement)

## 2015-02-25 NOTE — Progress Notes (Signed)
Pt worked in today for pain/ tingling / burning in her right arm since last night. Pt states that it has eased up a little but when she lays down the pain starts again.

## 2015-02-26 ENCOUNTER — Encounter: Payer: Self-pay | Admitting: Obstetrics and Gynecology

## 2015-03-04 ENCOUNTER — Ambulatory Visit (INDEPENDENT_AMBULATORY_CARE_PROVIDER_SITE_OTHER): Payer: Medicaid Other | Admitting: Advanced Practice Midwife

## 2015-03-04 ENCOUNTER — Encounter: Payer: Self-pay | Admitting: Advanced Practice Midwife

## 2015-03-04 VITALS — BP 120/70 | HR 72 | Wt 204.0 lb

## 2015-03-04 DIAGNOSIS — Z3493 Encounter for supervision of normal pregnancy, unspecified, third trimester: Secondary | ICD-10-CM

## 2015-03-04 DIAGNOSIS — Z1389 Encounter for screening for other disorder: Secondary | ICD-10-CM

## 2015-03-04 DIAGNOSIS — Z331 Pregnant state, incidental: Secondary | ICD-10-CM

## 2015-03-04 LAB — POCT URINALYSIS DIPSTICK
GLUCOSE UA: NEGATIVE
Ketones, UA: NEGATIVE
NITRITE UA: NEGATIVE
PROTEIN UA: NEGATIVE

## 2015-03-04 NOTE — Patient Instructions (Signed)

## 2015-03-04 NOTE — Progress Notes (Signed)
G1P0 6940w5d Estimated Date of Delivery: 04/03/15  Blood pressure 120/70, pulse 72, weight 204 lb (92.534 kg), last menstrual period 08/04/2014.   BP weight and urine results all reviewed and noted.  Please refer to the obstetrical flow sheet for the fundal height and fetal heart rate documentation:  Patient reports good fetal movement, denies any bleeding and no rupture of membranes symptoms or regular contractions. Patient is without complaints. All questions were answered.  Orders Placed This Encounter  Procedures  . POCT urinalysis dipstick    Plan:  Continued routine obstetrical care,   Return in about 1 week (around 03/11/2015) for LROB.

## 2015-03-11 ENCOUNTER — Ambulatory Visit (INDEPENDENT_AMBULATORY_CARE_PROVIDER_SITE_OTHER): Payer: Medicaid Other | Admitting: Advanced Practice Midwife

## 2015-03-11 ENCOUNTER — Encounter: Payer: Self-pay | Admitting: Advanced Practice Midwife

## 2015-03-11 VITALS — BP 110/60 | HR 74 | Wt 202.0 lb

## 2015-03-11 DIAGNOSIS — Z3493 Encounter for supervision of normal pregnancy, unspecified, third trimester: Secondary | ICD-10-CM

## 2015-03-11 DIAGNOSIS — Z369 Encounter for antenatal screening, unspecified: Secondary | ICD-10-CM

## 2015-03-11 DIAGNOSIS — Z1389 Encounter for screening for other disorder: Secondary | ICD-10-CM

## 2015-03-11 DIAGNOSIS — Z331 Pregnant state, incidental: Secondary | ICD-10-CM

## 2015-03-11 LAB — OB RESULTS CONSOLE GBS: GBS: POSITIVE

## 2015-03-11 NOTE — Progress Notes (Signed)
G1P0 6619w5d Estimated Date of Delivery: 04/03/15  Blood pressure 110/60, pulse 74, weight 202 lb (91.627 kg), last menstrual period 08/04/2014.   BP weight and urine results all reviewed and noted.  Please refer to the obstetrical flow sheet for the fundal height and fetal heart rate documentation:  Patient reports good fetal movement, denies any bleeding and no rupture of membranes symptoms or regular contractions. Patient is without complaints. All questions were answered.  Orders Placed This Encounter  Procedures  . GC/Chlamydia Probe Amp  . Strep Gp B NAA+Rflx  . POCT urinalysis dipstick    Plan:  Continued routine obstetrical care, gbs today  Return in about 1 week (around 03/18/2015) for LROB.

## 2015-03-11 NOTE — Progress Notes (Signed)
Pt denies any problems or concerns at this time.  

## 2015-03-13 LAB — GC/CHLAMYDIA PROBE AMP
Chlamydia trachomatis, NAA: NEGATIVE
Neisseria gonorrhoeae by PCR: NEGATIVE

## 2015-03-18 ENCOUNTER — Ambulatory Visit (INDEPENDENT_AMBULATORY_CARE_PROVIDER_SITE_OTHER): Payer: Medicaid Other | Admitting: Advanced Practice Midwife

## 2015-03-18 ENCOUNTER — Telehealth: Payer: Self-pay | Admitting: Advanced Practice Midwife

## 2015-03-18 VITALS — BP 118/74 | HR 60 | Temp 98.3°F | Wt 205.5 lb

## 2015-03-18 DIAGNOSIS — Z1389 Encounter for screening for other disorder: Secondary | ICD-10-CM

## 2015-03-18 DIAGNOSIS — Z331 Pregnant state, incidental: Secondary | ICD-10-CM

## 2015-03-18 DIAGNOSIS — Z3493 Encounter for supervision of normal pregnancy, unspecified, third trimester: Secondary | ICD-10-CM

## 2015-03-18 LAB — POCT URINALYSIS DIPSTICK
GLUCOSE UA: NEGATIVE
GLUCOSE UA: NEGATIVE
Ketones, UA: NEGATIVE
Nitrite, UA: NEGATIVE
Protein, UA: NEGATIVE
Protein, UA: NEGATIVE

## 2015-03-18 MED ORDER — ANTIPYRINE-BENZOCAINE 5.4-1.4 % OT SOLN: 3.0000 [drp] | OTIC | Status: DC | PRN

## 2015-03-18 MED ORDER — PRAMOXINE-HC-CHLOROXYLENOL 10-10-1 MG/ML OT SOLN: 4.0000 [drp] | Freq: Four times a day (QID) | OTIC | Status: DC

## 2015-03-18 NOTE — Telephone Encounter (Signed)
Spoke with pt. Pt's left ear is stopped up and hurting. + sinus symptoms x 1 week. No fever. I advised she needs to be seen. Call transferred to front desk for appt. JSY

## 2015-03-18 NOTE — Addendum Note (Signed)
Addended by: Jacklyn ShellRESENZO-DISHMON, Shaaron Golliday on: 03/18/2015 04:38 PM   Modules accepted: Orders

## 2015-03-18 NOTE — Progress Notes (Signed)
G1P0 8322w5d Estimated Date of Delivery: 04/03/15  Blood pressure 118/74, pulse 60, temperature 98.3 F (36.8 C), weight 205 lb 8 oz (93.214 kg), last menstrual period 08/04/2014.   BP weight and urine results all reviewed and noted.  Please refer to the obstetrical flow sheet for the fundal height and fetal heart rate documentation:  Patient reports good fetal movement, denies any bleeding and no rupture of membranes symptoms or regular contractions. Patient has had L ear pain for about a week.  Has used peroxide, ear wax removal.  There is some wax, but also what appear to be a small laceration.  No pain in neck or with movement of ear.  All questions were answered.  Orders Placed This Encounter  Procedures  . POCT urinalysis dipstick    Plan:  Continued routine obstetrical care, rx arugalaan drops  Return in about 1 week (around 03/25/2015) for LROB.

## 2015-03-19 ENCOUNTER — Encounter: Payer: Medicaid Other | Admitting: Obstetrics & Gynecology

## 2015-03-19 LAB — STREP GP B SUSCEPTIBILITY

## 2015-03-19 LAB — STREP GP B NAA+RFLX: Strep Gp B NAA+Rflx: POSITIVE — AB

## 2015-03-22 ENCOUNTER — Telehealth: Payer: Self-pay | Admitting: Advanced Practice Midwife

## 2015-03-22 ENCOUNTER — Telehealth: Payer: Self-pay | Admitting: *Deleted

## 2015-03-22 NOTE — Telephone Encounter (Signed)
Pt informed an alternative med e-scribed, [facility] states would cost pt out of pocket $23.00. Pt verbalized understanding.

## 2015-03-22 NOTE — Telephone Encounter (Signed)
Pt informed both med Drenda FreezeFran prescribed for ear aches has been discontinued.

## 2015-03-23 ENCOUNTER — Telehealth: Payer: Self-pay | Admitting: *Deleted

## 2015-03-23 ENCOUNTER — Encounter (HOSPITAL_COMMUNITY): Payer: Self-pay | Admitting: *Deleted

## 2015-03-23 ENCOUNTER — Other Ambulatory Visit: Payer: Self-pay | Admitting: Advanced Practice Midwife

## 2015-03-23 ENCOUNTER — Inpatient Hospital Stay (HOSPITAL_COMMUNITY)
Admission: AD | Admit: 2015-03-23 | Discharge: 2015-03-23 | Disposition: A | Discharge status: Home / Self Care | Payer: Medicaid Other | Source: Ambulatory Visit | Attending: Obstetrics & Gynecology | Admitting: Obstetrics & Gynecology

## 2015-03-23 DIAGNOSIS — O26893 Other specified pregnancy related conditions, third trimester: Secondary | ICD-10-CM | POA: Diagnosis not present

## 2015-03-23 DIAGNOSIS — O0932 Supervision of pregnancy with insufficient antenatal care, second trimester: Secondary | ICD-10-CM

## 2015-03-23 DIAGNOSIS — N898 Other specified noninflammatory disorders of vagina: Secondary | ICD-10-CM | POA: Diagnosis not present

## 2015-03-23 DIAGNOSIS — Z3493 Encounter for supervision of normal pregnancy, unspecified, third trimester: Secondary | ICD-10-CM

## 2015-03-23 DIAGNOSIS — Z3A38 38 weeks gestation of pregnancy: Secondary | ICD-10-CM | POA: Insufficient documentation

## 2015-03-23 DIAGNOSIS — O4693 Antepartum hemorrhage, unspecified, third trimester: Secondary | ICD-10-CM | POA: Diagnosis present

## 2015-03-23 LAB — POCT FERN TEST: POCT Fern Test: NEGATIVE

## 2015-03-23 MED ORDER — PRAMOXINE-HC-CHLOROXYLENOL 10-10-1 MG/ML OT SOLN
4.0000 [drp] | Freq: Four times a day (QID) | OTIC | Status: DC
Start: 2015-03-23 — End: 2015-03-25

## 2015-03-23 MED ORDER — BENZOCAINE 20 % OT SOLN: 1.0000 [drp] | Freq: Four times a day (QID) | OTIC | Status: DC | PRN

## 2015-03-23 NOTE — Telephone Encounter (Signed)
Pt aware ear drops sent to pharmacy. JSY

## 2015-03-23 NOTE — Progress Notes (Signed)
L Leftwich Kirby CNM notified of pt's admission and status. RN to reck cervix. If no change and no bright bleeding ok for pt to go home with labor precautions. [facility]

## 2015-03-23 NOTE — Telephone Encounter (Signed)
Pt feels better--no ear pain. Feels stopped up only.  Will not rx meds.

## 2015-03-23 NOTE — MAU Note (Addendum)
About 1200 went to [facility] and saw pink on tissue and then saw dark red on pad and now is brown. Occ ctxs. Good FM. No fld running down legs just the blood on panties. Unsure if water has broken

## 2015-03-23 NOTE — Telephone Encounter (Signed)
[facility] said Susan Atkinson was not available, pharmacist said would need to prescribed oral med. Please advise?

## 2015-03-23 NOTE — Progress Notes (Signed)
Written and verbal d/c instructions given and understanding voiced. 

## 2015-03-23 NOTE — Progress Notes (Signed)
Pt unaware of ctxs. 

## 2015-03-23 NOTE — Telephone Encounter (Signed)
It was in epic, but I resent it anyway. Drenda FreezeFran

## 2015-03-23 NOTE — Discharge Instructions (Signed)
Braxton Hicks Contractions °Contractions of the uterus can occur throughout pregnancy. Contractions are not always a sign that you are in labor.  °WHAT ARE BRAXTON HICKS CONTRACTIONS?  °Contractions that occur before labor are called Braxton Hicks contractions, or false labor. Toward the end of pregnancy (32-34 weeks), these contractions can develop more often and may become more forceful. This is not true labor because these contractions do not result in opening (dilatation) and thinning of the cervix. They are sometimes difficult to tell apart from true labor because these contractions can be forceful and people have different pain tolerances. You should not feel embarrassed if you go to the hospital with false labor. Sometimes, the only way to tell if you are in true labor is for your health care provider to look for changes in the cervix. °If there are no prenatal problems or other health problems associated with the pregnancy, it is completely safe to be sent home with false labor and await the onset of true labor. °HOW CAN YOU TELL THE DIFFERENCE BETWEEN TRUE AND FALSE LABOR? °False Labor °· The contractions of false labor are usually shorter and not as hard as those of true labor.   °· The contractions are usually irregular.   °· The contractions are often felt in the front of the lower abdomen and in the groin.   °· The contractions may go away when you walk around or change positions while lying down.   °· The contractions get weaker and are shorter lasting as time goes on.   °· The contractions do not usually become progressively stronger, regular, and closer together as with true labor.   °True Labor °· Contractions in true labor last 30-70 seconds, become very regular, usually become more intense, and increase in frequency.   °· The contractions do not go away with walking.   °· The discomfort is usually felt in the top of the uterus and spreads to the lower abdomen and low back.   °· True labor can be  determined by your health care provider with an exam. This will show that the cervix is dilating and getting thinner.   °WHAT TO REMEMBER °· Keep up with your usual exercises and follow other instructions given by your health care provider.   °· Take medicines as directed by your health care provider.   °· Keep your regular prenatal appointments.   °· Eat and drink lightly if you think you are going into labor.   °· If Braxton Hicks contractions are making you uncomfortable:   °¨ Change your position from lying down or resting to walking, or from walking to resting.   °¨ Sit and rest in a tub of warm water.   °¨ Drink 2-3 glasses of water. Dehydration may cause these contractions.   °¨ Do slow and deep breathing several times an hour.   °WHEN SHOULD I SEEK IMMEDIATE MEDICAL CARE? °Seek immediate medical care if: °· Your contractions become stronger, more regular, and closer together.   °· You have fluid leaking or gushing from your vagina.   °· You have a fever.   °· You pass blood-tinged mucus.   °· You have vaginal bleeding.   °· You have continuous abdominal pain.   °· You have low back pain that you never had before.   °· You feel your baby's head pushing down and causing pelvic pressure.   °· Your baby is not moving as much as it used to.   °  °This information is not intended to replace advice given to you by your health care provider. Make sure you discuss any questions you have with your health care   provider. °  °Document Released: 04/24/2005 Document Revised: 04/29/2013 Document Reviewed: 02/03/2013 °Elsevier Interactive Patient Education ©2016 [employer] ° °

## 2015-03-23 NOTE — Telephone Encounter (Signed)
Pt called and stated that she went to the bathroom and her underwear were wet and she had a bloody discharge. Pt stated that she had a lot of pressure and some irregular contractions this morning. Pt states that the baby is moving well. Pt denied and gush of fluid. I spoke with Joellyn HaffKim Booker and she advised that the pt would need [facility]. Pt's mother got on the phone and stated that the pt had changed her underwear and that she had a darker blood tinge in her underwear. Pt's mother stated that she was going to go ahead and [facility].

## 2015-03-23 NOTE — Progress Notes (Signed)
Fern slide made  

## 2015-03-23 NOTE — MAU Provider Note (Signed)
  History     CSN: [id number]  Arrival date and time: 03/23/15 1251   First Provider Initiated Contact with Patient 03/23/15 1343      Chief Complaint  Patient presents with  . Rupture of Membranes  . Vaginal Bleeding   HPI  Pt is G1P0 at 5119w3d pregnant, [facility] [place]. Today pt presents with leakage of fluid and pink discharge about 11 am and pantiliner wet afterwards.  This occurred after pt went to bathroom. Pt is not aware of any ctx/abdominal pain Fetus active RN note:  Expand All Collapse All   About 1200 went to [facility] and saw pink on tissue and then saw dark red on pad and now is brown. Occ ctxs. Good FM. No fld running down legs just the blood on panties. Unsure if water has broken      Past Medical History  Diagnosis Date  . Pregnant 11/17/2014    History reviewed. No pertinent past surgical history.  History reviewed. No pertinent family history.  Social History  Substance Use Topics  . Smoking status: Never Smoker   . Smokeless tobacco: Never Used  . Alcohol Use: No    Allergies: No Known Allergies  Prescriptions prior to admission  Medication Sig Dispense Refill Last Dose  . prenatal vitamin w/FE, FA (PRENATAL 1 + 1) 27-1 MG TABS tablet Take 1 tablet by mouth daily at 12 noon. 30 each 11 03/23/2015 at Unknown time  . antipyrine-benzocaine (AURALGAN) otic solution Place 3-4 drops into the left ear every 2 (two) hours as needed for ear pain. (Patient not taking: Reported on 03/23/2015) 10 mL 0 Not Taking at Unknown time  . benzocaine (OTICAINE) 20 % SOLN Place 1 drop into the left ear 4 (four) times daily as needed. (Patient not taking: Reported on 03/23/2015) 15 mL 0 Not Taking at Unknown time  . Pramoxine-HC-Chloroxylenol 10-10-1 MG/ML SOLN Place 4 drops in ear(s) 4 (four) times daily. (Patient not taking: Reported on 03/23/2015) 10 mL 0 Not Taking at Unknown time    Review of Systems  Gastrointestinal:  Negative for nausea, vomiting, abdominal pain, diarrhea and constipation.  Genitourinary: Negative for dysuria.  Neurological: Negative for headaches.   Physical Exam   Blood pressure 123/73, pulse 95, temperature 98.4 F (36.9 C), resp. rate 20, height 5' 8.25" (1.734 m), weight 203 lb 9.6 oz (92.352 kg), last menstrual period 08/04/2014, SpO2 98 %.  Physical Exam  Nursing note and vitals reviewed. Constitutional: She is oriented to person, place, and time. She appears well-developed.  HENT:  Head: Normocephalic.  Eyes: Pupils are equal, round, and reactive to light.  Neck: Normal range of motion. Neck supple.  Cardiovascular: Normal rate.   Respiratory: Effort normal.  GI: Soft.  Mild ctx every 5 to 7 min; reactive NST with 15 x 15 accelerations- no decelerations  Genitourinary:  Opaque Pink tinged creamy discharge in vault- no pooling or watery discharge noted; cervix ~1-1.5cm 60% effaced  Musculoskeletal: Normal range of motion.  Neurological: She is alert and oriented to person, place, and time.  Skin: Skin is warm and dry.  Psychiatric: She has a normal mood and affect.    MAU Course  Procedures Reactive NST Mild ctx every 5 to 7 minutes Fern slide neg- pt turned over to RN for labor check  Assessment and Plan  Vaginal discharge at term pregnancy- neg fern RN labor check  Susan Atkinson 03/23/2015, 1:46 PM

## 2015-03-25 ENCOUNTER — Ambulatory Visit (INDEPENDENT_AMBULATORY_CARE_PROVIDER_SITE_OTHER): Payer: Medicaid Other | Admitting: Obstetrics & Gynecology

## 2015-03-25 ENCOUNTER — Encounter: Payer: Self-pay | Admitting: Obstetrics & Gynecology

## 2015-03-25 VITALS — BP 120/70 | HR 84 | Wt 206.0 lb

## 2015-03-25 DIAGNOSIS — Z3493 Encounter for supervision of normal pregnancy, unspecified, third trimester: Secondary | ICD-10-CM

## 2015-03-25 DIAGNOSIS — Z331 Pregnant state, incidental: Secondary | ICD-10-CM

## 2015-03-25 DIAGNOSIS — Z1389 Encounter for screening for other disorder: Secondary | ICD-10-CM

## 2015-03-25 LAB — POCT URINALYSIS DIPSTICK
GLUCOSE UA: NEGATIVE
Ketones, UA: NEGATIVE
NITRITE UA: NEGATIVE
Protein, UA: NEGATIVE
RBC UA: NEGATIVE

## 2015-03-25 NOTE — Progress Notes (Signed)
G1P0 2181w5d Estimated Date of Delivery: 04/03/15  Blood pressure 120/70, pulse 84, weight 206 lb (93.441 kg), last menstrual period 08/04/2014.   BP weight and urine results all reviewed and noted.  Please refer to the obstetrical flow sheet for the fundal height and fetal heart rate documentation:  Patient reports good fetal movement, denies any bleeding and no rupture of membranes symptoms or regular contractions. Patient is without complaints. All questions were answered.  Orders Placed This Encounter  Procedures  . POCT urinalysis dipstick    Plan:  Continued routine obstetrical care,   Return in about 1 week (around 04/01/2015) for LROB.

## 2015-03-31 ENCOUNTER — Encounter: Payer: Self-pay | Admitting: Women's Health

## 2015-03-31 ENCOUNTER — Encounter (HOSPITAL_COMMUNITY): Payer: Self-pay | Admitting: *Deleted

## 2015-03-31 ENCOUNTER — Telehealth (HOSPITAL_COMMUNITY): Payer: Self-pay | Admitting: *Deleted

## 2015-03-31 ENCOUNTER — Ambulatory Visit (INDEPENDENT_AMBULATORY_CARE_PROVIDER_SITE_OTHER): Payer: Medicaid Other | Admitting: Women's Health

## 2015-03-31 VITALS — BP 124/78 | HR 76 | Wt 207.0 lb

## 2015-03-31 DIAGNOSIS — Z3403 Encounter for supervision of normal first pregnancy, third trimester: Secondary | ICD-10-CM

## 2015-03-31 DIAGNOSIS — Z3A39 39 weeks gestation of pregnancy: Secondary | ICD-10-CM | POA: Diagnosis not present

## 2015-03-31 DIAGNOSIS — Z3493 Encounter for supervision of normal pregnancy, unspecified, third trimester: Secondary | ICD-10-CM

## 2015-03-31 DIAGNOSIS — Z331 Pregnant state, incidental: Secondary | ICD-10-CM

## 2015-03-31 DIAGNOSIS — Z1389 Encounter for screening for other disorder: Secondary | ICD-10-CM

## 2015-03-31 LAB — POCT URINALYSIS DIPSTICK
GLUCOSE UA: NEGATIVE
KETONES UA: NEGATIVE
NITRITE UA: NEGATIVE

## 2015-03-31 NOTE — Telephone Encounter (Signed)
Preadmission screen  

## 2015-03-31 NOTE — Progress Notes (Signed)
Low-risk OB appointment G1P0 6137w4d Estimated Date of Delivery: 04/03/15 BP 124/78 mmHg  Pulse 76  Wt 207 lb (93.895 kg)  LMP 08/04/2014 (Exact Date)  BP, weight, and urine reviewed.  Refer to obstetrical flow sheet for FH & FHR.  Reports good fm.  Denies regular uc's, lof, vb, or uti s/s. No complaints. SVE: 3/60/ballotable- feels like vtx but uncertain- vtx confirmed w/ informal u/s, requests membrane sweeping- reviewed risks/benefits- decided to proceed, so membranes swept Reviewed labor s/s, fkc. Plan:  Continue routine obstetrical care. IOL for postdates scheduled for 12/3 @ 0730 if needed for postdates F/U in 1wk for OB appointment and nst for postdates

## 2015-03-31 NOTE — Patient Instructions (Addendum)
Your induction is scheduled for 12/3 @ 7:30am. [facility]Emergency[facility] entrance and let them know you are there to be induced. They will send someone from Labor & Delivery to come get you.    Call the office [phone]) or go to [facility] if:  You begin to have strong, frequent contractions  Your water breaks.  Sometimes it is a big gush of fluid, sometimes it is just a trickle that keeps getting your panties wet or running down your legs  You have vaginal bleeding.  It is normal to have a small amount of spotting if your cervix was checked.   You don't feel your baby moving like normal.  If you don't, get you something to eat and drink and lay down and focus on feeling your baby move.  You should feel at least 10 movements in 2 hours.  If you don't, you should call the office or go to [facility].    [facility] DivisionBraxton Hicks Contractions Contractions of the uterus can occur throughout pregnancy. Contractions are not always a sign that you are in labor.  WHAT ARE BRAXTON HICKS CONTRACTIONS?  Contractions that occur before labor are called Braxton Hicks contractions, or false labor. Toward the end of pregnancy (32-34 weeks), these contractions can develop more often and may become more forceful. This is not true labor because these contractions do not result in opening (dilatation) and thinning of the cervix. They are sometimes difficult to tell apart from true labor because these contractions can be forceful and people have different pain tolerances. You should not feel embarrassed if you go to the hospital with false labor. Sometimes, the only way to tell if you are in true labor is for your health care provider to look for changes in the cervix. If there are no prenatal problems or other health problems associated with the pregnancy, it is completely safe to be sent home with false labor and await the onset of true labor. HOW CAN YOU TELL THE DIFFERENCE  BETWEEN TRUE AND FALSE LABOR? False Labor  The contractions of false labor are usually shorter and not as hard as those of true labor.   The contractions are usually irregular.   The contractions are often felt in the front of the lower abdomen and in the groin.   The contractions may go away when you walk around or change positions while lying down.   The contractions get weaker and are shorter lasting as time goes on.   The contractions do not usually become progressively stronger, regular, and closer together as with true labor.  True Labor  Contractions in true labor last 30-70 seconds, become very regular, usually become more intense, and increase in frequency.   The contractions do not go away with walking.   The discomfort is usually felt in the top of the uterus and spreads to the lower abdomen and low back.   True labor can be determined by your health care provider with an exam. This will show that the cervix is dilating and getting thinner.  WHAT TO REMEMBER  Keep up with your usual exercises and follow other instructions given by your health care provider.   Take medicines as directed by your health care provider.   Keep your regular prenatal appointments.   Eat and drink lightly if you think you are going into labor.   If Braxton Hicks contractions are making you uncomfortable:   Change your position from lying down or  resting to walking, or from walking to resting.   Sit and rest in a tub of warm water.   Drink 2-3 glasses of water. Dehydration may cause these contractions.   Do slow and deep breathing several times an hour.  WHEN SHOULD I SEEK IMMEDIATE MEDICAL CARE? Seek immediate medical care if:  Your contractions become stronger, more regular, and closer together.   You have fluid leaking or gushing from your vagina.   You have a fever.   You pass blood-tinged mucus.   You have vaginal bleeding.   You have continuous  abdominal pain.   You have low back pain that you never had before.   You feel your baby's head pushing down and causing pelvic pressure.   Your baby is not moving as much as it used to.    This information is not intended to replace advice given to you by your health care provider. Make sure you discuss any questions you have with your health care provider.   Document Released: 04/24/2005 Document Revised: 04/29/2013 Document Reviewed: 02/03/2013 Elsevier Interactive Patient Education [employer].

## 2015-04-04 ENCOUNTER — Encounter (HOSPITAL_COMMUNITY): Payer: Self-pay | Admitting: *Deleted

## 2015-04-04 ENCOUNTER — Inpatient Hospital Stay (HOSPITAL_COMMUNITY): Payer: Medicaid Other | Admitting: Anesthesiology

## 2015-04-04 ENCOUNTER — Inpatient Hospital Stay (HOSPITAL_COMMUNITY)
Admission: AD | Admit: 2015-04-04 | Discharge: 2015-04-06 | DRG: 775 | Disposition: A | Discharge status: Home / Self Care | Payer: Medicaid Other | Source: Ambulatory Visit | Attending: Family Medicine | Admitting: Family Medicine

## 2015-04-04 DIAGNOSIS — O99824 Streptococcus B carrier state complicating childbirth: Secondary | ICD-10-CM | POA: Diagnosis present

## 2015-04-04 DIAGNOSIS — Z6831 Body mass index (BMI) 31.0-31.9, adult: Secondary | ICD-10-CM

## 2015-04-04 DIAGNOSIS — O480 Post-term pregnancy: Principal | ICD-10-CM | POA: Diagnosis present

## 2015-04-04 DIAGNOSIS — Z3A40 40 weeks gestation of pregnancy: Secondary | ICD-10-CM

## 2015-04-04 DIAGNOSIS — O99214 Obesity complicating childbirth: Secondary | ICD-10-CM | POA: Diagnosis present

## 2015-04-04 DIAGNOSIS — E669 Obesity, unspecified: Secondary | ICD-10-CM | POA: Diagnosis present

## 2015-04-04 DIAGNOSIS — Z3490 Encounter for supervision of normal pregnancy, unspecified, unspecified trimester: Secondary | ICD-10-CM

## 2015-04-04 DIAGNOSIS — O0932 Supervision of pregnancy with insufficient antenatal care, second trimester: Secondary | ICD-10-CM

## 2015-04-04 LAB — CBC
HEMATOCRIT: 40.4 % (ref 36.0–46.0)
HEMOGLOBIN: 13.9 g/dL (ref 12.0–15.0)
MCH: 30.3 pg (ref 26.0–34.0)
MCHC: 34.4 g/dL (ref 30.0–36.0)
MCV: 88 fL (ref 78.0–100.0)
PLATELETS: 169 10*3/uL (ref 150–400)
RBC: 4.59 MIL/uL (ref 3.87–5.11)
RDW: 12.9 % (ref 11.5–15.5)
WBC: 11.9 10*3/uL — AB (ref 4.0–10.5)

## 2015-04-04 LAB — TYPE AND SCREEN
ABO/RH(D): B POS
Antibody Screen: NEGATIVE

## 2015-04-04 LAB — ABO/RH: ABO/RH(D): B POS

## 2015-04-04 LAB — POCT FERN TEST

## 2015-04-04 MED ORDER — PHENYLEPHRINE 40 MCG/ML (10ML) SYRINGE FOR IV PUSH (FOR BLOOD PRESSURE SUPPORT)
80.0000 ug | PREFILLED_SYRINGE | INTRAVENOUS | Status: DC | PRN
  Filled 2015-04-04: qty 2
  Filled 2015-04-04: qty 20

## 2015-04-04 MED ORDER — OXYTOCIN 40 UNITS IN LACTATED RINGERS INFUSION - SIMPLE MED
1.0000 m[IU]/min | INTRAVENOUS | Status: DC
  Administered 2015-04-04: 2 m[IU]/min via INTRAVENOUS
  Filled 2015-04-04: qty 1000

## 2015-04-04 MED ORDER — OXYCODONE-ACETAMINOPHEN 5-325 MG PO TABS: 2.0000 | ORAL_TABLET | ORAL | Status: DC | PRN

## 2015-04-04 MED ORDER — LIDOCAINE HCL (PF) 1 % IJ SOLN
30.0000 mL | INTRAMUSCULAR | Status: DC | PRN
  Filled 2015-04-04 (×2): qty 30

## 2015-04-04 MED ORDER — ONDANSETRON HCL 4 MG/2ML IJ SOLN
4.0000 mg | Freq: Four times a day (QID) | INTRAMUSCULAR | Status: DC | PRN
  Administered 2015-04-04: 4 mg via INTRAVENOUS
  Filled 2015-04-04: qty 2

## 2015-04-04 MED ORDER — LIDOCAINE HCL (PF) 1 % IJ SOLN
INTRAMUSCULAR | Status: DC | PRN
  Administered 2015-04-04 (×2): 4 mL

## 2015-04-04 MED ORDER — OXYTOCIN 40 UNITS IN LACTATED RINGERS INFUSION - SIMPLE MED
62.5000 mL/h | INTRAVENOUS | Status: DC
  Administered 2015-04-05: 62.5 mL/h via INTRAVENOUS

## 2015-04-04 MED ORDER — SODIUM CHLORIDE 0.9 % IJ SOLN: 3.0000 mL | INTRAMUSCULAR | Status: DC | PRN

## 2015-04-04 MED ORDER — ACETAMINOPHEN 325 MG PO TABS: 650.0000 mg | ORAL_TABLET | ORAL | Status: DC | PRN

## 2015-04-04 MED ORDER — EPHEDRINE 5 MG/ML INJ
10.0000 mg | INTRAVENOUS | Status: DC | PRN
  Filled 2015-04-04: qty 2

## 2015-04-04 MED ORDER — TERBUTALINE SULFATE 1 MG/ML IJ SOLN
0.2500 mg | Freq: Once | INTRAMUSCULAR | Status: DC | PRN
  Filled 2015-04-04: qty 1

## 2015-04-04 MED ORDER — OXYCODONE-ACETAMINOPHEN 5-325 MG PO TABS: 1.0000 | ORAL_TABLET | ORAL | Status: DC | PRN

## 2015-04-04 MED ORDER — SODIUM CHLORIDE 0.9 % IV SOLN: 250.0000 mL | INTRAVENOUS | Status: DC | PRN

## 2015-04-04 MED ORDER — DIPHENHYDRAMINE HCL 50 MG/ML IJ SOLN: 12.5000 mg | INTRAMUSCULAR | Status: DC | PRN

## 2015-04-04 MED ORDER — PENICILLIN G POTASSIUM 5000000 UNITS IJ SOLR
2.5000 10*6.[IU] | INTRAMUSCULAR | Status: DC
  Administered 2015-04-04 – 2015-04-05 (×3): 2.5 10*6.[IU] via INTRAVENOUS
  Filled 2015-04-04 (×7): qty 2.5

## 2015-04-04 MED ORDER — CITRIC ACID-SODIUM CITRATE 334-500 MG/5ML PO SOLN: 30.0000 mL | ORAL | Status: DC | PRN

## 2015-04-04 MED ORDER — OXYTOCIN BOLUS FROM INFUSION
500.0000 mL | INTRAVENOUS | Status: DC
  Administered 2015-04-05: 500 mL via INTRAVENOUS

## 2015-04-04 MED ORDER — SODIUM CHLORIDE 0.9 % IJ SOLN: 3.0000 mL | Freq: Two times a day (BID) | INTRAMUSCULAR | Status: DC

## 2015-04-04 MED ORDER — PENICILLIN G POTASSIUM 5000000 UNITS IJ SOLR
5.0000 10*6.[IU] | Freq: Once | INTRAVENOUS | Status: AC
  Administered 2015-04-04: 5 10*6.[IU] via INTRAVENOUS
  Filled 2015-04-04: qty 5

## 2015-04-04 MED ORDER — OXYTOCIN 10 UNIT/ML IJ SOLN
10.0000 [IU] | Freq: Once | INTRAMUSCULAR | Status: DC
Start: 2015-04-04 — End: 2015-04-06

## 2015-04-04 MED ORDER — HYDROXYZINE HCL 50 MG PO TABS
50.0000 mg | ORAL_TABLET | Freq: Four times a day (QID) | ORAL | Status: DC | PRN
  Filled 2015-04-04: qty 1

## 2015-04-04 MED ORDER — FENTANYL 2.5 MCG/ML BUPIVACAINE 1/10 % EPIDURAL INFUSION (WH - ANES)
14.0000 mL/h | INTRAMUSCULAR | Status: DC | PRN
  Administered 2015-04-04 – 2015-04-05 (×2): 14 mL/h via EPIDURAL
  Filled 2015-04-04 (×2): qty 125

## 2015-04-04 MED ORDER — FENTANYL CITRATE (PF) 100 MCG/2ML IJ SOLN: 50.0000 ug | INTRAMUSCULAR | Status: DC | PRN

## 2015-04-04 MED ORDER — LACTATED RINGERS IV SOLN
500.0000 mL | INTRAVENOUS | Status: DC | PRN
Start: 2015-04-04 — End: 2015-04-06
  Administered 2015-04-04: 500 mL via INTRAVENOUS

## 2015-04-04 MED ORDER — LACTATED RINGERS IV SOLN
INTRAVENOUS | Status: DC
  Administered 2015-04-04 (×2): via INTRAVENOUS

## 2015-04-04 NOTE — Progress Notes (Signed)
Susan Atkinson is a 19 y.o. G1P0 at 5270w1d by ultrasound admitted for rupture of membranes  Subjective:   Objective: BP 122/76 mmHg  Pulse 102  Temp(Src) 98.6 F (37 C) (Oral)  Resp 18  Ht 5' 8.25" (1.734 m)  Wt 207 lb (93.895 kg)  BMI 31.23 kg/m2  LMP 08/04/2014 (Exact Date)      FHT:  FHR: 140 bpm, variability: moderate,  accelerations:  Present,  decelerations:  Absent UC:   occasional SVE:   Dilation: 5 Effacement (%): 90 Station: -1 Exam by:: [employer] CorporationSmith RN  Labs: Lab Results  Component Value Date   WBC 11.9* 04/04/2015   HGB 13.9 04/04/2015   HCT 40.4 04/04/2015   MCV 88.0 04/04/2015   PLT 169 04/04/2015    Assessment / Plan: augmentation of labor  Labor: Progressing normally Preeclampsia:  no signs or symptoms of toxicity and intake and ouput balanced Fetal Wellbeing:  Category I Pain Control:  Labor support without medications I/D:  n/a Anticipated MOD:  NSVD  Susan Atkinson 04/04/2015, 6:37 PM

## 2015-04-04 NOTE — Progress Notes (Signed)
Susan Atkinson is a 19 y.o. G1P0 at 5464w1d by ultrasound admitted for rupture of membranes  Subjective:   Objective: BP 126/73 mmHg  Pulse 91  Temp(Src) 98.5 F (36.9 C) (Oral)  Resp 18  Ht 5' 8.25" (1.734 m)  Wt 207 lb (93.895 kg)  BMI 31.23 kg/m2  LMP 08/04/2014 (Exact Date)      FHT:  FHR: 145 bpm, variability: moderate,  accelerations:  Present,  decelerations:  Absent UC:   none SVE:   Dilation: 3.5 Effacement (%): 80 Station: -2 Exam by:: [employer]Smith  Labs: Lab Results  Component Value Date   WBC 11.9* 04/04/2015   HGB 13.9 04/04/2015   HCT 40.4 04/04/2015   MCV 88.0 04/04/2015   PLT 169 04/04/2015    Assessment / Plan: Augmentation of labor, progressing well  Labor: Progressing normally Preeclampsia:  no signs or symptoms of toxicity and intake and ouput balanced Fetal Wellbeing:  Category I Pain Control:  Labor support without medications I/D:  n/a Anticipated MOD:  NSVD  LAWSON, MARIE DARLENE 04/04/2015, 2:44 PM

## 2015-04-04 NOTE — Anesthesia Procedure Notes (Signed)
Epidural Patient location during procedure: OB  Staffing Anesthesiologist: Brittannie Tawney Performed by: anesthesiologist   Preanesthetic Checklist Completed: patient identified, site marked, surgical consent, pre-op evaluation, timeout performed, IV checked, risks and benefits discussed and monitors and equipment checked  Epidural Patient position: sitting Prep: site prepped and draped and DuraPrep Patient monitoring: continuous pulse ox and blood pressure Approach: midline Location: L3-L4 Injection technique: LOR saline  Needle:  Needle type: Tuohy  Needle gauge: 17 G Needle length: 9 cm and 9 Needle insertion depth: 7 cm Catheter type: closed end flexible Catheter size: 19 Gauge Catheter at skin depth: 12 cm Test dose: negative  Assessment Events: blood not aspirated, injection not painful, no injection resistance, negative IV test and no paresthesia  Additional Notes Patient identified. Risks/Benefits/Options discussed with patient including but not limited to bleeding, infection, nerve damage, paralysis, failed block, incomplete pain control, headache, blood pressure changes, nausea, vomiting, reactions to medication both or allergic, itching and postpartum back pain. Confirmed with bedside nurse the patient's most recent platelet count. Confirmed with patient that they are not currently taking any anticoagulation, have any bleeding history or any family history of bleeding disorders. Patient expressed understanding and wished to proceed. All questions were answered. Sterile technique was used throughout the entire procedure. Please see nursing notes for vital signs. Test dose was given through epidural catheter and negative prior to continuing to dose epidural or start infusion. Warning signs of high block given to the patient including shortness of breath, tingling/numbness in hands, complete motor block, or any concerning symptoms with instructions to call for help. Patient was  given instructions on fall risk and not to get out of bed. All questions and concerns addressed with instructions to call with any issues or inadequate analgesia.      

## 2015-04-04 NOTE — MAU Note (Signed)
Pt presents to MAU with complaints of contractions that started last night but have gotten worse throughout the night. Reports that her underwear is damp at times.

## 2015-04-04 NOTE — Discharge Instructions (Signed)

## 2015-04-04 NOTE — Anesthesia Preprocedure Evaluation (Signed)

## 2015-04-04 NOTE — Progress Notes (Signed)
   Susan Atkinson is a 19 y.o. G1P0 at 2031w1d  admitted for active labor, rupture of membranes  Subjective: Doing well, no complaints. Epidural in place.   Objective: Filed Vitals:   04/04/15 2031 04/04/15 2101 04/04/15 2131 04/04/15 2201  BP: 120/75 117/66 98/85 115/73  Pulse: 84 89 87 68  Temp:  98.3 F (36.8 C)    TempSrc:      Resp:  18    Height:      Weight:          FHT:  FHR: 135 bpm, variability: moderate,  accelerations:  Present,  decelerations:  Absent UC:   regular, every 2-4 minutes SVE:   Dilation: 8 Effacement (%): 90 Station: +1 Exam by:: E. Rothermel RN  Pitocin @ 6 mu/min  Labs: Lab Results  Component Value Date   WBC 11.9* 04/04/2015   HGB 13.9 04/04/2015   HCT 40.4 04/04/2015   MCV 88.0 04/04/2015   PLT 169 04/04/2015    Assessment / Plan: Spontaneous labor, progressing normally  Labor: Progressing normally and Continue pitocin. Contracting well, making change Fetal Wellbeing:  Category I Pain Control:  Epidural Anticipated MOD:  NSVD  Susan Atkinson 04/04/2015, 10:24 PM

## 2015-04-04 NOTE — H&P (Signed)
Susan Atkinson is a 19 y.o. female presenting for contractions and SROM. Maternal Medical History:  Reason for admission: Rupture of membranes.   Contractions: Onset was 3-5 hours ago.   Perceived severity is mild.    Fetal activity: Perceived fetal activity is normal.   Last perceived fetal movement was within the past hour.    Prenatal complications: no prenatal complications Prenatal Complications - Diabetes: none.    OB History    Gravida Para Term Preterm AB TAB SAB Ectopic Multiple Living   1              Past Medical History  Diagnosis Date  . Pregnant 11/17/2014  . Medical history non-contributory    Past Surgical History  Procedure Laterality Date  . No past surgeries     Family History: family history is not on file. Social History:  reports that she has never smoked. She has never used smokeless tobacco. She reports that she does not drink alcohol or use illicit drugs.   Prenatal Transfer Tool  Maternal Diabetes: No Genetic Screening: Normal Maternal Ultrasounds/Referrals: Normal Fetal Ultrasounds or other Referrals:  None Maternal Substance Abuse:  No Significant Maternal Medications:  None Significant Maternal Lab Results:  Lab values include: Group B Strep positive Other Comments:  None  Review of Systems  Constitutional: Negative.   HENT: Negative.   Eyes: Negative.   Respiratory: Negative.   Cardiovascular: Negative.   Gastrointestinal: Positive for abdominal pain.  Genitourinary: Negative.   Musculoskeletal: Negative.   Skin: Negative.   Neurological: Negative.   Endo/Heme/Allergies: Negative.   Psychiatric/Behavioral: Negative.     Dilation: 3.5 Effacement (%): 80 Station: -3 Exam by:: Susan Oldee Carter RN Blood pressure 126/82, pulse 95, temperature 97.8 F (36.6 C), resp. rate 18, last menstrual period 08/04/2014. Maternal Exam:  Uterine Assessment: Contraction strength is mild.  Contraction frequency is regular.   Abdomen: Patient  reports no abdominal tenderness. Fetal presentation: vertex  Introitus: Normal vulva. Normal vagina.  Amniotic fluid character: clear.  Pelvis: adequate for delivery.   Cervix: Cervix evaluated by digital exam.     Fetal Exam Fetal Monitor Review: Mode: ultrasound.   Variability: moderate (6-25 bpm).    Fetal State Assessment: Category I - tracings are normal.     Physical Exam  Constitutional: She is oriented to person, place, and time. She appears well-developed and well-nourished.  HENT:  Head: Normocephalic.  Eyes: Pupils are equal, round, and reactive to light.  Cardiovascular: Normal rate, regular rhythm, normal heart sounds and intact distal pulses.   Respiratory: Effort normal and breath sounds normal.  GI: Soft. Bowel sounds are normal.  Genitourinary: Vagina normal and uterus normal.  Musculoskeletal: Normal range of motion.  Neurological: She is alert and oriented to person, place, and time. She has normal reflexes.  Skin: Skin is warm and dry.  Psychiatric: She has a normal mood and affect. Her behavior is normal. Judgment and thought content normal.    Prenatal labs: ABO, Rh: B/Positive/-- (07/22 1215) Antibody: Negative (09/08 0853) Rubella: 5.76 (07/22 1215) RPR: Non Reactive (09/08 0853)  HBsAg: Negative (07/22 1215)  HIV: Non Reactive (09/08 0853)  GBS: Positive (11/03 0000)   Assessment/Plan: Early labor at 40.1, SROM with clear fluid noted.   Susan Atkinson, Susan Atkinson 04/04/2015, 11:33 AM

## 2015-04-05 ENCOUNTER — Encounter (HOSPITAL_COMMUNITY): Payer: Self-pay | Admitting: *Deleted

## 2015-04-05 DIAGNOSIS — O99214 Obesity complicating childbirth: Secondary | ICD-10-CM

## 2015-04-05 DIAGNOSIS — E669 Obesity, unspecified: Secondary | ICD-10-CM

## 2015-04-05 DIAGNOSIS — Z6831 Body mass index (BMI) 31.0-31.9, adult: Secondary | ICD-10-CM

## 2015-04-05 DIAGNOSIS — Z3A40 40 weeks gestation of pregnancy: Secondary | ICD-10-CM

## 2015-04-05 DIAGNOSIS — O99824 Streptococcus B carrier state complicating childbirth: Secondary | ICD-10-CM

## 2015-04-05 DIAGNOSIS — O480 Post-term pregnancy: Secondary | ICD-10-CM

## 2015-04-05 LAB — RPR: RPR Ser Ql: NONREACTIVE

## 2015-04-05 MED ORDER — DIBUCAINE 1 % RE OINT: 1.0000 "application " | TOPICAL_OINTMENT | RECTAL | Status: DC | PRN

## 2015-04-05 MED ORDER — WITCH HAZEL-GLYCERIN EX PADS: 1.0000 "application " | MEDICATED_PAD | CUTANEOUS | Status: DC | PRN

## 2015-04-05 MED ORDER — TETANUS-DIPHTH-ACELL PERTUSSIS 5-2.5-18.5 LF-MCG/0.5 IM SUSP: 0.5000 mL | Freq: Once | INTRAMUSCULAR | Status: DC

## 2015-04-05 MED ORDER — SENNOSIDES-DOCUSATE SODIUM 8.6-50 MG PO TABS
2.0000 | ORAL_TABLET | ORAL | Status: DC
  Administered 2015-04-05: 2 via ORAL
  Filled 2015-04-05: qty 2

## 2015-04-05 MED ORDER — ACETAMINOPHEN 325 MG PO TABS: 650.0000 mg | ORAL_TABLET | ORAL | Status: DC | PRN

## 2015-04-05 MED ORDER — OXYCODONE-ACETAMINOPHEN 5-325 MG PO TABS: 2.0000 | ORAL_TABLET | ORAL | Status: DC | PRN

## 2015-04-05 MED ORDER — PRENATAL MULTIVITAMIN CH
1.0000 | ORAL_TABLET | Freq: Every day | ORAL | Status: DC
  Administered 2015-04-05: 1 via ORAL
  Filled 2015-04-05: qty 1

## 2015-04-05 MED ORDER — BENZOCAINE-MENTHOL 20-0.5 % EX AERO: 1.0000 "application " | INHALATION_SPRAY | CUTANEOUS | Status: DC | PRN

## 2015-04-05 MED ORDER — ONDANSETRON HCL 4 MG/2ML IJ SOLN: 4.0000 mg | INTRAMUSCULAR | Status: DC | PRN

## 2015-04-05 MED ORDER — LANOLIN HYDROUS EX OINT: TOPICAL_OINTMENT | CUTANEOUS | Status: DC | PRN

## 2015-04-05 MED ORDER — SIMETHICONE 80 MG PO CHEW: 80.0000 mg | CHEWABLE_TABLET | ORAL | Status: DC | PRN

## 2015-04-05 MED ORDER — ONDANSETRON HCL 4 MG PO TABS: 4.0000 mg | ORAL_TABLET | ORAL | Status: DC | PRN

## 2015-04-05 MED ORDER — IBUPROFEN 600 MG PO TABS
600.0000 mg | ORAL_TABLET | Freq: Four times a day (QID) | ORAL | Status: DC
  Administered 2015-04-05 – 2015-04-06 (×5): 600 mg via ORAL
  Filled 2015-04-05 (×5): qty 1

## 2015-04-05 MED ORDER — DIPHENHYDRAMINE HCL 25 MG PO CAPS: 25.0000 mg | ORAL_CAPSULE | Freq: Four times a day (QID) | ORAL | Status: DC | PRN

## 2015-04-05 MED ORDER — OXYCODONE-ACETAMINOPHEN 5-325 MG PO TABS: 1.0000 | ORAL_TABLET | ORAL | Status: DC | PRN

## 2015-04-05 MED ORDER — ZOLPIDEM TARTRATE 5 MG PO TABS: 5.0000 mg | ORAL_TABLET | Freq: Every evening | ORAL | Status: DC | PRN

## 2015-04-05 NOTE — Progress Notes (Signed)
UR chart review completed.  

## 2015-04-06 LAB — CBC
HEMATOCRIT: 34.3 % — AB (ref 36.0–46.0)
Hemoglobin: 11.8 g/dL — ABNORMAL LOW (ref 12.0–15.0)
MCH: 30.4 pg (ref 26.0–34.0)
MCHC: 34.4 g/dL (ref 30.0–36.0)
MCV: 88.4 fL (ref 78.0–100.0)
PLATELETS: 135 10*3/uL — AB (ref 150–400)
RBC: 3.88 MIL/uL (ref 3.87–5.11)
RDW: 13.1 % (ref 11.5–15.5)
WBC: 14.4 10*3/uL — ABNORMAL HIGH (ref 4.0–10.5)

## 2015-04-06 MED ORDER — IBUPROFEN 600 MG PO TABS: 600.0000 mg | ORAL_TABLET | Freq: Four times a day (QID) | ORAL | Status: DC

## 2015-04-06 NOTE — Anesthesia Postprocedure Evaluation (Signed)
Anesthesia Post Note  Patient: Susan ProphetArielle J Lamaster  Procedure(s) Performed: * No procedures listed *  Patient location during evaluation: Mother Baby Anesthesia Type: Epidural Level of consciousness: awake and alert and oriented Pain management: pain level controlled Vital Signs Assessment: post-procedure vital signs reviewed and stable Respiratory status: spontaneous breathing Cardiovascular status: blood pressure returned to baseline and stable Postop Assessment: no headache, no backache, patient able to bend at knees, no signs of nausea or vomiting and adequate PO intake Anesthetic complications: no    Last Vitals:  Filed Vitals:   04/05/15 2010 04/06/15 0650  BP: 118/63 111/60  Pulse: 82 62  Temp: 36.8 C 36.5 C  Resp: 18 18    Last Pain:  Filed Vitals:   04/06/15 0708  PainSc: 0-No pain                 Jamison Yuhasz

## 2015-04-06 NOTE — Progress Notes (Signed)
POSTPARTUM PROGRESS NOTE  Post Partum Day 1 Subjective:  Susan Atkinson is a 19 y.o. G1P1001 3544w2d s/p nsvd.  No acute events overnight.  Pt denies problems with ambulating, voiding or po intake.  She denies nausea or vomiting.  Pain is well controlled.  She has had flatus. She has not had bowel movement.  Lochia Small.   Objective: Blood pressure 118/63, pulse 82, temperature 98.2 F (36.8 C), temperature source Oral, resp. rate 18, height 5' 8.25" (1.734 m), weight 207 lb (93.895 kg), last menstrual period 08/04/2014, unknown if currently breastfeeding.  Physical Exam:  General: alert, cooperative and no distress Lochia:normal flow Chest: CTAB Heart: RRR no m/r/g Abdomen: +BS, soft, nontender,  Uterine Fundus: firm,  DVT Evaluation: No calf swelling or tenderness Extremities: trace edema   Recent Labs  04/04/15 1145  HGB 13.9  HCT 40.4    Assessment/Plan:  ASSESSMENT: Susan Atkinson is a 19 y.o. G1P1001 6144w2d s/p nsvd, doing well. Thinks may want to d/c this afternoon and will let Susan Atkinson know.    LOS: 2 days   Susan Atkinson 04/06/2015, 6:44 AM

## 2015-04-06 NOTE — Discharge Summary (Signed)
OB Discharge Summary     Patient Name: Susan Atkinson DOB: [birth year] MRN: [id number]  Date of admission: 04/04/2015 Delivering MD: Yolande JollyMELANCON, CALEB G   Date of discharge: 04/06/2015  Admitting diagnosis: 40wks, CTX, Pressure Intrauterine pregnancy: 5766w2d     Secondary diagnosis:  Principal Problem:   NSVD (normal spontaneous vaginal delivery) Active Problems:   Supervision of normal pregnancy   Late prenatal care in second trimester   Labor and delivery, indication for care  Additional problems: none     Discharge diagnosis: Term Pregnancy Delivered                                                                                                Post partum procedures:none  Augmentation: Pitocin  Complications: None  Hospital course:  Onset of Labor With Vaginal Delivery     19 y.o. yo G1P1001 at 6566w2d was admitted in Latent Laboron 04/04/2015. Patient had an uncomplicated labor course as follows:  Membrane Rupture Time/Date: 11:10 AM ,04/04/2015   Intrapartum Procedures: Episiotomy: None [1]                                         Lacerations:  None [1]  Patient had a delivery of a Viable infant. 04/05/2015  Information for the patient's newborn:  Trinna BalloonWilson, Boy Hermena [[id number]][[id number]]  Delivery Method: Vaginal, Spontaneous Delivery (Filed from Delivery Summary)   Pateint had an uncomplicated postpartum course.  She is ambulating, tolerating a regular diet, passing flatus, and urinating well. Patient is discharged home in stable condition on 04/06/2015.    Physical exam  Filed Vitals:   04/05/15 1300 04/05/15 1801 04/05/15 2010 04/06/15 0650  BP: 117/72 139/78 118/63 111/60  Pulse: 88 80 82 62  Temp: 97.8 F (36.6 C) 98.2 F (36.8 C) 98.2 F (36.8 C) 97.7 F (36.5 C)  TempSrc: Oral Oral Oral   Resp: 20 19 18 18   Height:      Weight:       General: alert, cooperative and no distress Lochia: appropriate Uterine Fundus: firm Incision: N/A DVT Evaluation: No  evidence of DVT seen on physical exam. Labs: Lab Results  Component Value Date   WBC 14.4* 04/06/2015   HGB 11.8* 04/06/2015   HCT 34.3* 04/06/2015   MCV 88.4 04/06/2015   PLT 135* 04/06/2015   No flowsheet data found.  Discharge instruction: per After Visit Summary and "Baby and Me Booklet".  After visit meds:    Medication List    TAKE these medications        ibuprofen 600 MG tablet  Commonly known as:  ADVIL,MOTRIN  Take 1 tablet (600 mg total) by mouth every 6 (six) hours.     prenatal vitamin w/FE, FA 27-1 MG Tabs tablet  Take 1 tablet by mouth daily at 12 noon.        Diet: routine diet  Activity: Advance as tolerated. Pelvic rest for 6 weeks.   Outpatient follow up:6 weeks Future Appointments Date Time Provider Department  Center  05/18/2015 10:00 AM Jacklyn Shell, CNM [facility]   Postpartum contraception: IUD Mirena  Newborn Data: Live born female  Birth Weight: 8 lb 4.3 oz (3751 g) APGAR: 8, 9  Baby Feeding: Breast Disposition:home with mother   04/06/2015 Federico Flake, MD

## 2015-04-07 ENCOUNTER — Other Ambulatory Visit: Payer: Medicaid Other | Admitting: Obstetrics and Gynecology

## 2015-04-07 DIAGNOSIS — Z029 Encounter for administrative examinations, unspecified: Secondary | ICD-10-CM

## 2015-04-10 ENCOUNTER — Inpatient Hospital Stay (HOSPITAL_COMMUNITY): Admission: RE | Admit: 2015-04-10 | Payer: Medicaid Other | Source: Ambulatory Visit

## 2015-05-18 ENCOUNTER — Ambulatory Visit (INDEPENDENT_AMBULATORY_CARE_PROVIDER_SITE_OTHER): Payer: Medicaid Other | Admitting: Advanced Practice Midwife

## 2015-05-18 DIAGNOSIS — Z3202 Encounter for pregnancy test, result negative: Secondary | ICD-10-CM | POA: Diagnosis not present

## 2015-05-18 DIAGNOSIS — Z3200 Encounter for pregnancy test, result unknown: Secondary | ICD-10-CM

## 2015-05-18 LAB — POCT URINE PREGNANCY: Preg Test, Ur: NEGATIVE

## 2015-05-18 MED ORDER — NORGESTIMATE-ETH ESTRADIOL 0.25-35 MG-MCG PO TABS: 1.0000 | ORAL_TABLET | Freq: Every day | ORAL | Status: DC

## 2015-05-18 NOTE — Progress Notes (Signed)
  Susan Atkinson is a 20 y.o. who presents for a postpartum visit. She is 6 weeks postpartum following a spontaneous vaginal delivery. I have fully reviewed the prenatal and intrapartum course. The delivery was at 40.2 gestational weeks.  Anesthesia: epidural. Postpartum course has been uneventful. Baby's course has been uneventful. Baby is feeding by bottle. Bleeding: no bleeding and Had perioid last weeks. Bowel function is normal. Bladder function is normal. Patient is not sexually active. Contraception method is OCP (estrogen/progesterone). Postpartum depression screening: negative.   Current outpatient prescriptions:  .  ibuprofen (ADVIL,MOTRIN) 600 MG tablet, Take 1 tablet (600 mg total) by mouth every 6 (six) hours., Disp: 90 tablet, Rfl: 0 .  prenatal vitamin w/FE, FA (PRENATAL 1 + 1) 27-1 MG TABS tablet, Take 1 tablet by mouth daily at 12 noon., Disp: 30 each, Rfl: 11  Review of Systems   Constitutional: Negative for fever and chills Eyes: Negative for visual disturbances Respiratory: Negative for shortness of breath, dyspnea Cardiovascular: Negative for chest pain or palpitations  Gastrointestinal: Negative for vomiting, diarrhea and constipation Genitourinary: Negative for dysuria and urgency Musculoskeletal: Negative for back pain, joint pain, myalgias  Neurological: Negative for dizziness and headaches   Objective:    There were no vitals filed for this visit. General:  alert, cooperative and no distress   Breasts:  negative  Lungs: clear to auscultation bilaterally  Heart:  regular rate and rhythm  Abdomen: Soft, nontender   Vulva:  normal  Vagina: normal vagina  Cervix:  closed  Corpus: Well involuted     Rectal Exam: no hemorrhoids        Assessment:    normal postpartum exam.  Plan:    1. Contraception: OCP (estrogen/progesterone) 2. Follow up in:   or as needed.

## 2015-06-16 ENCOUNTER — Ambulatory Visit (INDEPENDENT_AMBULATORY_CARE_PROVIDER_SITE_OTHER): Payer: Medicaid Other | Admitting: Advanced Practice Midwife

## 2015-06-16 ENCOUNTER — Encounter: Payer: Self-pay | Admitting: Advanced Practice Midwife

## 2015-06-16 VITALS — BP 112/80 | HR 52 | Ht 68.0 in | Wt 184.0 lb

## 2015-06-16 DIAGNOSIS — N921 Excessive and frequent menstruation with irregular cycle: Secondary | ICD-10-CM | POA: Diagnosis not present

## 2015-06-16 NOTE — Progress Notes (Signed)
   [facility] Clinic Visit  Patient name: Susan Atkinson MRN [id number]  Date of birth: 1995-06-30  CC & HPI:  Susan Atkinson is a 20 y.o. African American female presenting today for [facility] discussion. She started ortho-cyclen 4 weeks ago, and is unhappy with the amount of BTB she has had. She has had some form of bleeding, (from light to heavy) every day.  She takes them the same time every day, has not missed a pill. Is on her scheduled WD bleed now.   Pertinent History Reviewed:  Medical & Surgical Hx:   Past Medical History  Diagnosis Date  . Pregnant 11/17/2014  . Medical history non-contributory    Past Surgical History  Procedure Laterality Date  . No past surgeries     History reviewed. No pertinent family history.  Current outpatient prescriptions:  .  ibuprofen (ADVIL,MOTRIN) 600 MG tablet, Take 1 tablet (600 mg total) by mouth every 6 (six) hours., Disp: 90 tablet, Rfl: 0 .  norgestimate-ethinyl estradiol (ORTHO-CYCLEN,SPRINTEC,PREVIFEM) 0.25-35 MG-MCG tablet, Take 1 tablet by mouth daily., Disp: 1 Package, Rfl: 11 .  prenatal vitamin w/FE, FA (PRENATAL 1 + 1) 27-1 MG TABS tablet, Take 1 tablet by mouth daily at 12 noon., Disp: 30 each, Rfl: 11 Social History: Reviewed -  reports that she has never smoked. She has never used smokeless tobacco.  Review of Systems:   Constitutional: Negative for fever and chills Eyes: Negative for visual disturbances Respiratory: Negative for shortness of breath, dyspnea Cardiovascular: Negative for chest pain or palpitations  Gastrointestinal: Negative for vomiting, diarrhea and constipation; no abdominal pain Genitourinary: Negative for dysuria and urgency, vaginal irritation or itching Musculoskeletal: Negative for back pain, joint pain, myalgias  Neurological: Negative for dizziness and headaches    Objective Findings:    Physical Examination: General appearance - well appearing, and in no distress Mental status - alert,  oriented to person, place, and time Chest:  Normal respiratory effort Heart - normal rate and regular rhythm Abdomen:  Soft, nontender Pelvic: deferred Musculoskeletal:  Normal range of motion without pain Extremities:  No edema  50% or more of this visit was spent in counseling and coordination of care.  10 minutes of face to face time.   No results found for this or any previous visit (from the past 24 hour(s)).    Assessment & Plan:  A:   BTB on new start COC P:  Normal BTB for 1st month of pills.  If still BTB after 3rd pack, consider a change.   No Follow-up on file.  CRESENZO-DISHMAN,Skilar Marcou CNM 06/16/2015 1:45 PM

## 2017-05-18 ENCOUNTER — Encounter: Payer: Self-pay | Admitting: Adult Health

## 2017-05-18 ENCOUNTER — Other Ambulatory Visit (HOSPITAL_COMMUNITY)
Admission: RE | Admit: 2017-05-18 | Discharge: 2017-05-18 | Disposition: A | Discharge status: Home / Self Care | Payer: Managed Care, Other (non HMO) | Source: Ambulatory Visit | Attending: Adult Health | Admitting: Adult Health

## 2017-05-18 ENCOUNTER — Ambulatory Visit (INDEPENDENT_AMBULATORY_CARE_PROVIDER_SITE_OTHER): Payer: Managed Care, Other (non HMO) | Admitting: Adult Health

## 2017-05-18 VITALS — BP 136/92 | HR 91 | Ht 67.5 in | Wt 180.5 lb

## 2017-05-18 DIAGNOSIS — Z3041 Encounter for surveillance of contraceptive pills: Secondary | ICD-10-CM | POA: Insufficient documentation

## 2017-05-18 DIAGNOSIS — Z01419 Encounter for gynecological examination (general) (routine) without abnormal findings: Secondary | ICD-10-CM | POA: Insufficient documentation

## 2017-05-18 DIAGNOSIS — Z3009 Encounter for other general counseling and advice on contraception: Secondary | ICD-10-CM | POA: Diagnosis not present

## 2017-05-18 DIAGNOSIS — Z113 Encounter for screening for infections with a predominantly sexual mode of transmission: Secondary | ICD-10-CM | POA: Diagnosis not present

## 2017-05-18 MED ORDER — NORGESTIMATE-ETH ESTRADIOL 0.25-35 MG-MCG PO TABS: 1.0000 | ORAL_TABLET | Freq: Every day | ORAL | 11 refills | Status: DC

## 2017-05-18 NOTE — Progress Notes (Signed)
Patient ID: Susan Atkinson, female   DOB: 1996-03-28, 22 y.o.   MRN: [id number] History of Present Illness: Susan Atkinson is a 22 year old black female in for a well woman gyn exam an pap.She is happy with her OCs.  PCP is Dr Sudie BaileyKnowlton.    Current Medications, Allergies, Past Medical History, Past Surgical History, Family History and Social History were reviewed in [name] electronic medical record.     Review of Systems: Patient denies any headaches, hearing loss, fatigue, blurred vision, shortness of breath, chest pain, abdominal pain, problems with bowel movements, urination, or intercourse. No joint pain or mood swings.    Physical Exam:BP (!) 136/92 (BP Location: Left Arm, Patient Position: Sitting, Cuff Size: Normal)   Pulse 91   Ht 5' 7.5" (1.715 m)   Wt 180 lb 8 oz (81.9 kg)   LMP 05/11/2017   BMI 27.85 kg/m  General:  Well developed, well nourished, no acute distress Skin:  Warm and dry Neck:  Midline trachea, normal thyroid, good ROM, no lymphadenopathy Lungs; Clear to auscultation bilaterally Breast:  No dominant palpable mass, retraction, or nipple discharge Cardiovascular: Regular rate and rhythm Abdomen:  Soft, non tender, no hepatosplenomegaly Pelvic:  External genitalia is normal in appearance, no lesions.  The vagina is normal in appearance. Urethra has no lesions or masses. The cervix is bulbous. Pap with GC/CHL performed.  Uterus is felt to be normal size, shape, and contour.  No adnexal masses or tenderness noted.Bladder is non tender, no masses felt. Extremities/musculoskeletal:  No swelling or varicosities noted, no clubbing or cyanosis Psych:  No mood changes, alert and cooperative,seems happy PHQ 2 score 0.  Impression: 1. Encounter for gynecological examination with Papanicolaou smear of cervix   2. Family planning   3. Screening examination for STD (sexually transmitted disease)   4. Encounter for surveillance of contraceptive pills        Plan:  Check HIV and RPR Meds ordered this encounter  Medications  . norgestimate-ethinyl estradiol (ORTHO-CYCLEN,SPRINTEC,PREVIFEM) 0.25-35 MG-MCG tablet    Sig: Take 1 tablet by mouth daily.    Dispense:  1 Package    Refill:  11    Order Specific Question:   Supervising Provider    Answer:   Susan Atkinson [[id number]]  Use condoms Physical in 1 year Pap in 3 if normal

## 2017-05-19 LAB — RPR: RPR Ser Ql: NONREACTIVE

## 2017-05-19 LAB — HIV ANTIBODY (ROUTINE TESTING W REFLEX): HIV Screen 4th Generation wRfx: NONREACTIVE

## 2017-05-22 LAB — CYTOLOGY - PAP
Chlamydia: NEGATIVE
DIAGNOSIS: NEGATIVE
Neisseria Gonorrhea: NEGATIVE

## 2017-05-23 ENCOUNTER — Telehealth: Payer: Self-pay | Admitting: Adult Health

## 2017-05-23 ENCOUNTER — Encounter: Payer: Self-pay | Admitting: Adult Health

## 2017-05-23 DIAGNOSIS — A599 Trichomoniasis, unspecified: Secondary | ICD-10-CM

## 2017-05-23 HISTORY — DX: Trichomoniasis, unspecified: A59.9

## 2017-05-23 MED ORDER — METRONIDAZOLE 500 MG PO TABS: 500.0000 mg | ORAL_TABLET | Freq: Two times a day (BID) | ORAL | 0 refills | Status: DC

## 2017-05-23 NOTE — Telephone Encounter (Signed)
Pt aware pap negative for malignancy and GC/CHL but +trich, will rx flagyl 500 mg 1 bid x 7 days and no alcohol or sex and partners to clinic and POC 06/04/17

## 2017-06-04 ENCOUNTER — Ambulatory Visit (INDEPENDENT_AMBULATORY_CARE_PROVIDER_SITE_OTHER): Payer: Managed Care, Other (non HMO) | Admitting: Adult Health

## 2017-06-04 ENCOUNTER — Other Ambulatory Visit: Payer: Self-pay

## 2017-06-04 ENCOUNTER — Encounter: Payer: Self-pay | Admitting: Adult Health

## 2017-06-04 VITALS — BP 122/82 | HR 73 | Ht 67.5 in | Wt 187.0 lb

## 2017-06-04 DIAGNOSIS — Z8619 Personal history of other infectious and parasitic diseases: Secondary | ICD-10-CM | POA: Diagnosis not present

## 2017-06-04 NOTE — Progress Notes (Signed)
Subjective:     Patient ID: Susan Atkinson, female   DOB: [birth year], [age] y.o.   MRN: [id number]  HPI Susan Atkinson is a [age] year old black female in for POC of recent trich on pap. She and partner took meds and have not had sex.   Review of Systems  Patient denies any headaches, hearing loss, fatigue, blurred vision, shortness of breath, chest pain, abdominal pain, problems with bowel movements, urination, or intercourse. No joint pain or mood swings. Reviewed past medical,surgical, social and family history. Reviewed medications and allergies.     Objective:   Physical Exam BP 122/82 (BP Location: Right Arm, Patient Position: Sitting, Cuff Size: Normal)   Pulse 73   Ht 5' 7.5" (1.715 m)   Wt 187 lb (84.8 kg)   LMP 05/11/2017   BMI 28.86 kg/m  Skin warm and dry.Pelvic: external genitalia is normal in appearance no lesions, vagina: +period like blood, no odor, Nuswab obtained,urethra has no lesions or masses noted, cervix:smooth.    Assessment:     1. History of trichomoniasis       Plan:      Will check for proof of cure with Nuswab F/U prn

## 2017-06-07 ENCOUNTER — Telehealth: Payer: Self-pay | Admitting: Adult Health

## 2017-06-07 LAB — NUSWAB VAGINITIS PLUS (VG+)
ATOPOBIUM VAGINAE: HIGH {score} — AB
BVAB 2: HIGH {score} — AB
Candida albicans, NAA: NEGATIVE
Candida glabrata, NAA: NEGATIVE
Chlamydia trachomatis, NAA: NEGATIVE
Neisseria gonorrhoeae, NAA: NEGATIVE
Trich vag by NAA: NEGATIVE

## 2017-06-07 MED ORDER — METRONIDAZOLE 500 MG PO TABS: 500.0000 mg | ORAL_TABLET | Freq: Two times a day (BID) | ORAL | 0 refills | Status: DC

## 2017-06-07 NOTE — Telephone Encounter (Signed)
Left message that +BV, no trich, no yeast or GC/CHL on nuswab

## 2018-12-26 ENCOUNTER — Other Ambulatory Visit: Payer: Self-pay

## 2018-12-26 DIAGNOSIS — Z20822 Contact with and (suspected) exposure to covid-19: Secondary | ICD-10-CM

## 2018-12-28 LAB — NOVEL CORONAVIRUS, NAA: SARS-CoV-2, NAA: NOT DETECTED

## 2019-01-24 ENCOUNTER — Other Ambulatory Visit: Payer: Self-pay | Admitting: *Deleted

## 2019-01-24 DIAGNOSIS — Z20822 Contact with and (suspected) exposure to covid-19: Secondary | ICD-10-CM

## 2019-01-26 LAB — NOVEL CORONAVIRUS, NAA: SARS-CoV-2, NAA: NOT DETECTED

## 2019-07-02 ENCOUNTER — Other Ambulatory Visit: Payer: Self-pay

## 2019-07-02 ENCOUNTER — Emergency Department (HOSPITAL_COMMUNITY)
Admission: EM | Admit: 2019-07-02 | Discharge: 2019-07-02 | Disposition: A | Discharge status: Home / Self Care | Payer: Managed Care, Other (non HMO) | Attending: Emergency Medicine | Admitting: Emergency Medicine

## 2019-07-02 ENCOUNTER — Encounter (HOSPITAL_COMMUNITY): Payer: Self-pay | Admitting: Emergency Medicine

## 2019-07-02 DIAGNOSIS — W25XXXA Contact with sharp glass, initial encounter: Secondary | ICD-10-CM | POA: Insufficient documentation

## 2019-07-02 DIAGNOSIS — Y939 Activity, unspecified: Secondary | ICD-10-CM | POA: Diagnosis not present

## 2019-07-02 DIAGNOSIS — S61210A Laceration without foreign body of right index finger without damage to nail, initial encounter: Secondary | ICD-10-CM

## 2019-07-02 DIAGNOSIS — Y929 Unspecified place or not applicable: Secondary | ICD-10-CM | POA: Diagnosis not present

## 2019-07-02 DIAGNOSIS — Y999 Unspecified external cause status: Secondary | ICD-10-CM | POA: Diagnosis not present

## 2019-07-02 DIAGNOSIS — Z79899 Other long term (current) drug therapy: Secondary | ICD-10-CM | POA: Insufficient documentation

## 2019-07-02 MED ORDER — LIDOCAINE HCL (PF) 2 % IJ SOLN
INTRAMUSCULAR | Status: AC
  Filled 2019-07-02: qty 10

## 2019-07-02 NOTE — Discharge Instructions (Addendum)
Have your sutures removed in 10 days.  Keep your wound clean and dry,  Until a good scab forms - you may then wash gently twice daily with mild soap and water, but dry completely after.  Get rechecked for any sign of infection (redness,  Swelling,  Increased pain or drainage of purulent fluid). ° °

## 2019-07-02 NOTE — ED Provider Notes (Signed)
[facility] EMERGENCY DEPARTMENT Provider Note   CSN: [id number] Arrival date & time: 07/02/19  1038     History Chief Complaint  Patient presents with  . Laceration    Susan Atkinson is a 24 y.o. female.  The history is provided by the patient.  Laceration Location:  Finger Finger laceration location:  R index finger Length:  1.5 cm Depth:  Through dermis Quality: straight   Bleeding: controlled   Laceration mechanism:  Broken glass (the rim of a drinking glass broke while doing dishes prior to arrival. ) Pain details:    Quality:  Dull   Severity:  Mild Foreign body present:  No foreign bodies Relieved by:  Pressure Worsened by:  Pressure Tetanus status:  Up to date Associated symptoms: no focal weakness, no numbness, no redness and no swelling        Past Medical History:  Diagnosis Date  . Medical history non-contributory   . Pregnant 11/17/2014  . Trichimoniasis 05/23/2017   +on pap , treated 05/23/17    Patient Active Problem List   Diagnosis Date Noted  . Trichimoniasis 05/23/2017  . Encounter for surveillance of contraceptive pills 05/18/2017  . Screening examination for STD (sexually transmitted disease) 05/18/2017  . Family planning 05/18/2017  . Encounter for gynecological examination with Papanicolaou smear of cervix 05/18/2017  . NSVD (normal spontaneous vaginal delivery) 04/06/2015  . Labor and delivery, indication for care 04/04/2015  . Late prenatal care in second trimester 02/04/2015  . Supervision of normal pregnancy 11/17/2014    Past Surgical History:  Procedure Laterality Date  . NO PAST SURGERIES       OB History    Gravida  1   Para  1   Term  1   Preterm      AB      Living  1     SAB      TAB      Ectopic      Multiple  0   Live Births  1           History reviewed. No pertinent family history.  Social History   Tobacco Use  . Smoking status: Never Smoker  . Smokeless tobacco: Never Used    Substance Use Topics  . Alcohol use: No  . Drug use: No    Home Medications Prior to Admission medications   Medication Sig Start Date End Date Taking? Authorizing Provider  metroNIDAZOLE (FLAGYL) 500 MG tablet Take 1 tablet (500 mg total) by mouth 2 (two) times daily. 06/07/17   Estill Dooms, NP  norgestimate-ethinyl estradiol (ORTHO-CYCLEN,SPRINTEC,PREVIFEM) 0.25-35 MG-MCG tablet Take 1 tablet by mouth daily. 05/18/17   Estill Dooms, NP    Allergies    Patient has no known allergies.  Review of Systems   Review of Systems  Constitutional: Negative.   Respiratory: Negative.   Cardiovascular: Negative.   Gastrointestinal: Negative.   Musculoskeletal: Negative for arthralgias.  Skin: Positive for wound.  Neurological: Negative for focal weakness, weakness and numbness.    Physical Exam Updated Vital Signs BP (!) 106/57 (BP Location: Left Arm)   Pulse 94   Temp 98.2 F (36.8 C) (Oral)   Resp 15   Ht 5\' 8"  (1.727 m)   Wt 86.2 kg   LMP 06/19/2019   SpO2 98%   BMI 28.89 kg/m   Physical Exam Constitutional:      Appearance: She is well-developed.  HENT:     Head: Normocephalic.  Cardiovascular:     Rate and Rhythm: Normal rate.  Pulmonary:     Effort: Pulmonary effort is normal.  Musculoskeletal:        General: No tenderness.     Right hand: Laceration present. No bony tenderness. Normal range of motion. Normal strength. Normal capillary refill.  Skin:    Findings: Laceration present.     Comments: 1.5 cm subcutaneous laceration right proximal index finger, radial edge.  Hemostatic.    Neurological:     Mental Status: She is alert and oriented to person, place, and time.     Sensory: Sensation is intact. No sensory deficit.     ED Results / Procedures / Treatments   Labs (all labs ordered are listed, but only abnormal results are displayed) Labs Reviewed - No data to display  EKG None  Radiology No results found.  Procedures Procedures  (including critical care time)  LACERATION REPAIR Performed by: Burgess Amor Authorized by: Burgess Amor Consent: Verbal consent obtained. Risks and benefits: risks, benefits and alternatives were discussed Consent given by: patient Patient identity confirmed: provided demographic data Prepped and Draped in normal sterile fashion Wound explored.  Base of wound clearly established in blood free field.  No retained fb.   Laceration Location: right index finger  Laceration Length: 1.5cm  No Foreign Bodies seen or palpated  Anesthesia: local infiltration  Local anesthetic: lidocaine 2% without epinephrine  Anesthetic total: 2 ml  Irrigation method: syringe Amount of cleaning: standard  Skin closure: ethilon 4-0  Number of sutures: 3  Technique: simple interupted  Patient tolerance: Patient tolerated the procedure well with no immediate complications.   Medications Ordered in ED Medications  lidocaine (XYLOCAINE) 2 % injection (  Given by Other 07/02/19 1153)    ED Course  I have reviewed the triage vital signs and the nursing notes.  Pertinent labs & imaging results that were available during my care of the patient were reviewed by me and considered in my medical decision making (see chart for details).    MDM Rules/Calculators/A&P                      Wound care instructions given.  Pt advised to have sutures removed in 10 days,  Return here sooner for any signs of infection including redness, swelling, worse pain or drainage of pus.    Final Clinical Impression(s) / ED Diagnoses Final diagnoses:  Laceration of right index finger without foreign body without damage to nail, initial encounter    Rx / DC Orders ED Discharge Orders    None       Victoriano Lain 07/02/19 1158    Cathren Laine, MD 07/02/19 1215

## 2019-07-02 NOTE — ED Triage Notes (Signed)
Patient with laceration to her R index finger. Bleeding controlled.

## 2019-07-12 ENCOUNTER — Other Ambulatory Visit: Payer: Self-pay

## 2019-07-12 ENCOUNTER — Ambulatory Visit
Admission: EM | Admit: 2019-07-12 | Discharge: 2019-07-12 | Disposition: A | Discharge status: Home / Self Care | Payer: Managed Care, Other (non HMO)

## 2019-07-12 DIAGNOSIS — Z4802 Encounter for removal of sutures: Secondary | ICD-10-CM

## 2019-07-12 NOTE — ED Triage Notes (Signed)
Pt here for suture removal. 3 sutures removed . Skin approximated and healing WNL

## 2020-01-27 ENCOUNTER — Encounter: Payer: Self-pay | Admitting: Adult Health

## 2020-01-27 ENCOUNTER — Ambulatory Visit (INDEPENDENT_AMBULATORY_CARE_PROVIDER_SITE_OTHER): Payer: Managed Care, Other (non HMO) | Admitting: Adult Health

## 2020-01-27 VITALS — BP 129/94 | HR 93 | Ht 67.25 in | Wt 219.0 lb

## 2020-01-27 DIAGNOSIS — Z0000 Encounter for general adult medical examination without abnormal findings: Secondary | ICD-10-CM | POA: Insufficient documentation

## 2020-01-27 DIAGNOSIS — Z01419 Encounter for gynecological examination (general) (routine) without abnormal findings: Secondary | ICD-10-CM | POA: Insufficient documentation

## 2020-01-27 DIAGNOSIS — Z713 Dietary counseling and surveillance: Secondary | ICD-10-CM

## 2020-01-27 DIAGNOSIS — Z7689 Persons encountering health services in other specified circumstances: Secondary | ICD-10-CM | POA: Insufficient documentation

## 2020-01-27 NOTE — Progress Notes (Signed)
Patient ID: Susan Atkinson, female   DOB: January 28, 1996, 24 y.o.   MRN: [id number] History of Present Illness:  Susan Atkinson is a 24 year old black female,single, G1P1 in for a well woman gyn exam, she had a normal pap with 05/18/17. She wants to talk about her weight too, would like to lose.  Current Medications, Allergies, Past Medical History, Past Surgical History, Family History and Social History were reviewed in [facility] record.     Review of Systems: Patient denies any headaches, hearing loss, fatigue, blurred vision, shortness of breath, chest pain, abdominal pain, problems with bowel movements, urination, or intercourse(not active). No joint pain or mood swings. Has gained some weight since COVID.   Physical Exam:BP (!) 129/94 (BP Location: Left Arm, Patient Position: Sitting, Cuff Size: Large)   Pulse 93   Ht 5' 7.25" (1.708 m)   Wt 219 lb (99.3 kg)   LMP 01/12/2020   BMI 34.05 kg/m  General:  Well developed, well nourished, no acute distress Skin:  Warm and dry Neck:  Midline trachea, normal thyroid, good ROM, no lymphadenopathy Lungs; Clear to auscultation bilaterally Breast:  No dominant palpable mass, retraction, or nipple discharge Cardiovascular: Regular rate and rhythm Abdomen:  Soft, non tender, no hepatosplenomegaly Pelvic:  External genitalia is normal in appearance, no lesions.  The vagina is normal in appearance. Urethra has no lesions or masses. The cervix is bulbous.  Uterus is felt to be normal size, shape, and contour.  No adnexal masses or tenderness noted.Bladder is non tender, no masses felt Extremities/musculoskeletal:  No swelling or varicosities noted, no clubbing or cyanosis Psych:  No mood changes, alert and cooperative,seems happy AA is 1 Fall risk is low PHQ 9 score is 0  [facility] - 01/27/20 0841      Pregnancy Intention Screening   Does the patient want to become pregnant in the next year? No    Does the patient's partner  want to become pregnant in the next year? No    Would the patient like to discuss contraceptive options today? No      Contraception Wrap Up   Current Method Abstinence    End Method Abstinence    Contraception Counseling Provided No         Examination chaperoned by Nance Pear LPN.   Impression and Plan: 1. Encounter for well woman exam with routine gynecological exam Pap and physical in 1 year Fasting labs next year   2. Weight loss counseling, encounter for Decrease calories and increase exercise Keep food log of calories Review handout on calorie counting for weight loss, 1500 calories per day A good weight is 159-166 for height

## 2020-01-27 NOTE — Patient Instructions (Signed)
Calorie Counting for Weight Loss °Calories are units of energy. Your body needs a certain amount of calories from food to keep you going throughout the day. When you eat more calories than your body needs, your body stores the extra calories as fat. When you eat fewer calories than your body needs, your body burns fat to get the energy it needs. °Calorie counting means keeping track of how many calories you eat and drink each day. Calorie counting can be helpful if you need to lose weight. If you make sure to eat fewer calories than your body needs, you should lose weight. Ask your health care provider what a healthy weight is for you. °For calorie counting to work, you will need to eat the right number of calories in a day in order to lose a healthy amount of weight per week. A dietitian can help you determine how many calories you need in a day and will give you suggestions on how to reach your calorie goal. °· A healthy amount of weight to lose per week is usually 1-2 lb (0.5-0.9 kg). This usually means that your daily calorie intake should be reduced by 500-750 calories. °· Eating 1,200 - 1,500 calories per day can help most women lose weight. °· Eating 1,500 - 1,800 calories per day can help most men lose weight. °What is my plan? °My goal is to have _____1500_____ calories per day. °If I have this many calories per day, I should lose around _____1-2_____ pounds per week. °What do I need to know about calorie counting? °In order to meet your daily calorie goal, you will need to: °· Find out how many calories are in each food you would like to eat. Try to do this before you eat. °· Decide how much of the food you plan to eat. °· Write down what you ate and how many calories it had. Doing this is called keeping a food log. °To successfully lose weight, it is important to balance calorie counting with a healthy lifestyle that includes regular activity. Aim for 150 minutes of moderate exercise (such as walking) or  75 minutes of vigorous exercise (such as running) each week. °Where do I find calorie information? ° °The number of calories in a food can be found on a Nutrition Facts label. If a food does not have a Nutrition Facts label, try to look up the calories online or ask your dietitian for help. °Remember that calories are listed per serving. If you choose to have more than one serving of a food, you will have to multiply the calories per serving by the amount of servings you plan to eat. For example, the label on a package of bread might say that a serving size is 1 slice and that there are 90 calories in a serving. If you eat 1 slice, you will have eaten 90 calories. If you eat 2 slices, you will have eaten 180 calories. °How do I keep a food log? °Immediately after each meal, record the following information in your food log: °· What you ate. Don't forget to include toppings, sauces, and other extras on the food. °· How much you ate. This can be measured in cups, ounces, or number of items. °· How many calories each food and drink had. °· The total number of calories in the meal. °Keep your food log near you, such as in a small notebook in your pocket, or use a mobile app or website. Some programs will calculate   calories for you and show you how many calories you have left for the day to meet your goal. °What are some calorie counting tips? ° °· Use your calories on foods and drinks that will fill you up and not leave you hungry: °? Some examples of foods that fill you up are nuts and nut butters, vegetables, lean proteins, and high-fiber foods like whole grains. High-fiber foods are foods with more than 5 g fiber per serving. °? Drinks such as sodas, specialty coffee drinks, alcohol, and juices have a lot of calories, yet do not fill you up. °· Eat nutritious foods and avoid empty calories. Empty calories are calories you get from foods or beverages that do not have many vitamins or protein, such as candy, sweets,  and soda. It is better to have a nutritious high-calorie food (such as an avocado) than a food with few nutrients (such as a bag of chips). °· Know how many calories are in the foods you eat most often. This will help you calculate calorie counts faster. °· Pay attention to calories in drinks. Low-calorie drinks include water and unsweetened drinks. °· Pay attention to nutrition labels for "low fat" or "fat free" foods. These foods sometimes have the same amount of calories or more calories than the full fat versions. They also often have added sugar, starch, or salt, to make up for flavor that was removed with the fat. °· Find a way of tracking calories that works for you. Get creative. Try different apps or programs if writing down calories does not work for you. °What are some portion control tips? °· Know how many calories are in a serving. This will help you know how many servings of a certain food you can have. °· Use a measuring cup to measure serving sizes. You could also try weighing out portions on a kitchen scale. With time, you will be able to estimate serving sizes for some foods. °· Take some time to put servings of different foods on your favorite plates, bowls, and cups so you know what a serving looks like. °· Try not to eat straight from a bag or box. Doing this can lead to overeating. Put the amount you would like to eat in a cup or on a plate to make sure you are eating the right portion. °· Use smaller plates, glasses, and bowls to prevent overeating. °· Try not to multitask (for example, watch TV or use your computer) while eating. If it is time to eat, sit down at a table and enjoy your food. This will help you to know when you are full. It will also help you to be aware of what you are eating and how much you are eating. °What are tips for following this plan? °Reading food labels °· Check the calorie count compared to the serving size. The serving size may be smaller than what you are used  to eating. °· Check the source of the calories. Make sure the food you are eating is high in vitamins and protein and low in saturated and trans fats. °Shopping °· Read nutrition labels while you shop. This will help you make healthy decisions before you decide to purchase your food. °· Make a grocery list and stick to it. °Cooking °· Try to cook your favorite foods in a healthier way. For example, try baking instead of frying. °· Use low-fat dairy products. °Meal planning °· Use more fruits and vegetables. Half of your plate should be fruits   and vegetables. °· Include lean proteins like poultry and fish. °How do I count calories when eating out? °· Ask for smaller portion sizes. °· Consider sharing an entree and sides instead of getting your own entree. °· If you get your own entree, eat only half. Ask for a box at the beginning of your meal and put the rest of your entree in it so you are not tempted to eat it. °· If calories are listed on the menu, choose the lower calorie options. °· Choose dishes that include vegetables, fruits, whole grains, low-fat dairy products, and lean protein. °· Choose items that are boiled, broiled, grilled, or steamed. Stay away from items that are buttered, battered, fried, or served with cream sauce. Items labeled "crispy" are usually fried, unless stated otherwise. °· Choose water, low-fat milk, unsweetened iced tea, or other drinks without added sugar. If you want an alcoholic beverage, choose a lower calorie option such as a glass of wine or light beer. °· Ask for dressings, sauces, and syrups on the side. These are usually high in calories, so you should limit the amount you eat. °· If you want a salad, choose a garden salad and ask for grilled meats. Avoid extra toppings like bacon, cheese, or fried items. Ask for the dressing on the side, or ask for olive oil and vinegar or lemon to use as dressing. °· Estimate how many servings of a food you are given. For example, a serving  of cooked rice is ½ cup or about the size of half a baseball. Knowing serving sizes will help you be aware of how much food you are eating at restaurants. The list below tells you how big or small some common portion sizes are based on everyday objects: °? 1 oz--4 stacked dice. °? 3 oz--1 deck of cards. °? 1 tsp--1 die. °? 1 Tbsp--½ a ping-pong ball. °? 2 Tbsp--1 ping-pong ball. °? ½ cup--½ baseball. °? 1 cup--1 baseball. °Summary °· Calorie counting means keeping track of how many calories you eat and drink each day. If you eat fewer calories than your body needs, you should lose weight. °· A healthy amount of weight to lose per week is usually 1-2 lb (0.5-0.9 kg). This usually means reducing your daily calorie intake by 500-750 calories. °· The number of calories in a food can be found on a Nutrition Facts label. If a food does not have a Nutrition Facts label, try to look up the calories online or ask your dietitian for help. °· Use your calories on foods and drinks that will fill you up, and not on foods and drinks that will leave you hungry. °· Use smaller plates, glasses, and bowls to prevent overeating. °This information is not intended to replace advice given to you by your health care provider. Make sure you discuss any questions you have with your health care provider. °Document Revised: 01/11/2018 Document Reviewed: 03/24/2016 °Elsevier Patient Education © 2020 [employer] ° °

## 2020-08-24 ENCOUNTER — Ambulatory Visit: Payer: Medicaid Other | Admitting: Nurse Practitioner

## 2020-09-07 ENCOUNTER — Encounter: Payer: Self-pay | Admitting: Nurse Practitioner

## 2020-09-07 ENCOUNTER — Ambulatory Visit: Payer: Managed Care, Other (non HMO) | Admitting: Nurse Practitioner

## 2020-09-07 ENCOUNTER — Other Ambulatory Visit: Payer: Self-pay

## 2020-09-07 VITALS — BP 129/84 | HR 105 | Temp 99.2°F | Resp 20 | Ht 68.0 in | Wt 224.0 lb

## 2020-09-07 DIAGNOSIS — Z139 Encounter for screening, unspecified: Secondary | ICD-10-CM | POA: Insufficient documentation

## 2020-09-07 DIAGNOSIS — Z7689 Persons encountering health services in other specified circumstances: Secondary | ICD-10-CM

## 2020-09-07 DIAGNOSIS — E669 Obesity, unspecified: Secondary | ICD-10-CM

## 2020-09-07 NOTE — Assessment & Plan Note (Signed)
-  states she has gained weight since COVID -may consider phentermine at time of physical if labs are good -if phentermine doesn't get results, may consider weight loss mgmt

## 2020-09-07 NOTE — Patient Instructions (Addendum)
Please have fasting labs drawn 2-3 days prior to your appointment so we can discuss the results during your office visit.    Obesity, Adult Obesity is the condition of having too much total body fat. Being overweight or obese means that your weight is greater than what is considered healthy for your body size. Obesity is determined by a measurement called BMI. BMI is an estimate of body fat and is calculated from height and weight. For adults, a BMI of 30 or higher is considered obese. Obesity can lead to other health concerns and major illnesses, including:  Stroke.  Coronary artery disease (CAD).  Type 2 diabetes.  Some types of cancer, including cancers of the colon, breast, uterus, and gallbladder.  Osteoarthritis.  High blood pressure (hypertension).  High cholesterol.  Sleep apnea.  Gallbladder stones.  Infertility problems. What are the causes? Common causes of this condition include:  Eating daily meals that are high in calories, sugar, and fat.  Being born with genes that may make you more likely to become obese.  Having a medical condition that causes obesity, including: ? Hypothyroidism. ? Polycystic ovarian syndrome (PCOS). ? Binge-eating disorder. ? Cushing syndrome.  Taking certain medicines, such as steroids, antidepressants, and seizure medicines.  Not being physically active (sedentary lifestyle).  Not getting enough sleep.  Drinking high amounts of sugar-sweetened beverages, such as soft drinks. What increases the risk? The following factors may make you more likely to develop this condition:  Having a family history of obesity.  Being a woman of African American descent.  Being a man of Hispanic descent.  Living in an area with limited access to: ? [place] [facility], recreation centers, or sidewalks. ? Healthy food choices, such as grocery stores and farmers' markets. What are the signs or symptoms? The main sign of this condition is having too much  body fat. How is this diagnosed? This condition is diagnosed based on:  Your BMI. If you are an adult with a BMI of 30 or higher, you are considered obese.  Your waist circumference. This measures the distance around your waistline.  Your skinfold thickness. Your health care provider may gently pinch a fold of your skin and measure it. You may have other tests to check for underlying conditions. How is this treated? Treatment for this condition often includes changing your lifestyle. Treatment may include some or all of the following:  Dietary changes. This may include developing a healthy meal plan.  Regular physical activity. This may include activity that causes your heart to beat faster (aerobic exercise) and strength training. Work with your health care provider to design an exercise program that works for you.  Medicine to help you lose weight if you are unable to lose 1 pound a week after 6 weeks of healthy eating and more physical activity.  Treating conditions that cause the obesity (underlying conditions).  Surgery. Surgical options may include gastric banding and gastric bypass. Surgery may be done if: ? Other treatments have not helped to improve your condition. ? You have a BMI of 40 or higher. ? You have life-threatening health problems related to obesity. Follow these instructions at home: Eating and drinking  Follow recommendations from your health care provider about what you eat and drink. Your health care provider may advise you to: ? Limit fast food, sweets, and processed snack foods. ? Choose low-fat options, such as low-fat milk instead of whole milk. ? Eat 5 or more servings of fruits or vegetables every day. ?  Eat at home more often. This gives you more control over what you eat. ? Choose healthy foods when you eat out. ? Learn to read food labels. This will help you understand how much food is considered 1 serving. ? Learn what a healthy serving size  is. ? Keep low-fat snacks available. ? Limit sugary drinks, such as soda, fruit juice, sweetened iced tea, and flavored milk.  Drink enough water to keep your urine pale yellow.  Do not follow a fad diet. Fad diets can be unhealthy and even dangerous.   Physical activity  Exercise regularly, as told by your health care provider. ? Most adults should get up to 150 minutes of moderate-intensity exercise every week. ? Ask your health care provider what types of exercise are safe for you and how often you should exercise.  Warm up and stretch before being active.  Cool down and stretch after being active.  Rest between periods of activity. Lifestyle  Work with your health care provider and a dietitian to set a weight-loss goal that is healthy and reasonable for you.  Limit your screen time.  Find ways to reward yourself that do not involve food.  Do not drink alcohol if: ? Your health care provider tells you not to drink. ? You are pregnant, may be pregnant, or are planning to become pregnant.  If you drink alcohol: ? Limit how much you use to:  0-1 drink a day for women.  0-2 drinks a day for men. ? Be aware of how much alcohol is in your drink. In the [place] one drink equals one 12 oz bottle of beer (355 mL), one 5 oz glass of wine (148 mL), or one 1 oz glass of hard liquor (44 mL). General instructions  Keep a weight-loss journal to keep track of the food you eat and how much exercise you get.  Take over-the-counter and prescription medicines only as told by your health care provider.  Take vitamins and supplements only as told by your health care provider.  Consider joining a support group. Your health care provider may be able to recommend a support group.  Keep all follow-up visits as told by your health care provider. This is important. Contact a health care provider if:  You are unable to meet your weight loss goal after 6 weeks of dietary and lifestyle  changes. Get help right away if you are having:  Trouble breathing.  Suicidal thoughts or behaviors. Summary  Obesity is the condition of having too much total body fat.  Being overweight or obese means that your weight is greater than what is considered healthy for your body size.  Work with your health care provider and a dietitian to set a weight-loss goal that is healthy and reasonable for you.  Exercise regularly, as told by your health care provider. Ask your health care provider what types of exercise are safe for you and how often you should exercise. This information is not intended to replace advice given to you by your health care provider. Make sure you discuss any questions you have with your health care provider. Document Revised: 12/27/2017 Document Reviewed: 12/27/2017 Elsevier Patient Education  2021 Elsevier Inc.  Calorie Counting for [employer] Calories are units of energy. Your body needs a certain number of calories from food to keep going throughout the day. When you eat or drink more calories than your body needs, your body stores the extra calories mostly as fat. When you eat or  drink fewer calories than your body needs, your body burns fat to get the energy it needs. Calorie counting means keeping track of how many calories you eat and drink each day. Calorie counting can be helpful if you need to lose weight. If you eat fewer calories than your body needs, you should lose weight. Ask your health care provider what a healthy weight is for you. For calorie counting to work, you will need to eat the right number of calories each day to lose a healthy amount of weight per week. A dietitian can help you figure out how many calories you need in a day and will suggest ways to reach your calorie goal.  A healthy amount of weight to lose each week is usually 1-2 lb (0.5-0.9 kg). This usually means that your daily calorie intake should be reduced by 500-750  calories.  Eating 1,200-1,500 calories a day can help most women lose weight.  Eating 1,500-1,800 calories a day can help most men lose weight. What do I need to know about calorie counting? Work with your health care provider or dietitian to determine how many calories you should get each day. To meet your daily calorie goal, you will need to:  Find out how many calories are in each food that you would like to eat. Try to do this before you eat.  Decide how much of the food you plan to eat.  Keep a food log. Do this by writing down what you ate and how many calories it had. To successfully lose weight, it is important to balance calorie counting with a healthy lifestyle that includes regular activity. Where do I find calorie information? The number of calories in a food can be found on a Nutrition Facts label. If a food does not have a Nutrition Facts label, try to look up the calories online or ask your dietitian for help. Remember that calories are listed per serving. If you choose to have more than one serving of a food, you will have to multiply the calories per serving by the number of servings you plan to eat. For example, the label on a package of bread might say that a serving size is 1 slice and that there are 90 calories in a serving. If you eat 1 slice, you will have eaten 90 calories. If you eat 2 slices, you will have eaten 180 calories.   How do I keep a food log? After each time that you eat, record the following in your food log as soon as possible:  What you ate. Be sure to include toppings, sauces, and other extras on the food.  How much you ate. This can be measured in cups, ounces, or number of items.  How many calories were in each food and drink.  The total number of calories in the food you ate. Keep your food log near you, such as in a pocket-sized notebook or on an app or website on your mobile phone. Some programs will calculate calories for you and show you how  many calories you have left to meet your daily goal. What are some portion-control tips?  Know how many calories are in a serving. This will help you know how many servings you can have of a certain food.  Use a measuring cup to measure serving sizes. You could also try weighing out portions on a kitchen scale. With time, you will be able to estimate serving sizes for some foods.  Take time  to put servings of different foods on your favorite plates or in your favorite bowls and cups so you know what a serving looks like.  Try not to eat straight from a food's packaging, such as from a bag or box. Eating straight from the package makes it hard to see how much you are eating and can lead to overeating. Put the amount you would like to eat in a cup or on a plate to make sure you are eating the right portion.  Use smaller plates, glasses, and bowls for smaller portions and to prevent overeating.  Try not to multitask. For example, avoid watching TV or using your computer while eating. If it is time to eat, sit down at a table and enjoy your food. This will help you recognize when you are full. It will also help you be more mindful of what and how much you are eating. What are tips for following this plan? Reading food labels  Check the calorie count compared with the serving size. The serving size may be smaller than what you are used to eating.  Check the source of the calories. Try to choose foods that are high in protein, fiber, and vitamins, and low in saturated fat, trans fat, and sodium. Shopping  Read nutrition labels while you shop. This will help you make healthy decisions about which foods to buy.  Pay attention to nutrition labels for low-fat or fat-free foods. These foods sometimes have the same number of calories or more calories than the full-fat versions. They also often have added sugar, starch, or salt to make up for flavor that was removed with the fat.  Make a grocery list of  lower-calorie foods and stick to it. Cooking  Try to cook your favorite foods in a healthier way. For example, try baking instead of frying.  Use low-fat dairy products. Meal planning  Use more fruits and vegetables. One-half of your plate should be fruits and vegetables.  Include lean proteins, such as chicken, [place], and fish. Lifestyle Each week, aim to do one of the following:  150 minutes of moderate exercise, such as walking.  75 minutes of vigorous exercise, such as running. General information  Know how many calories are in the foods you eat most often. This will help you calculate calorie counts faster.  Find a way of tracking calories that works for you. Get creative. Try different apps or programs if writing down calories does not work for you. What foods should I eat?  Eat nutritious foods. It is better to have a nutritious, high-calorie food, such as an avocado, than a food with few nutrients, such as a bag of potato chips.  Use your calories on foods and drinks that will fill you up and will not leave you hungry soon after eating. ? Examples of foods that fill you up are nuts and nut butters, vegetables, lean proteins, and high-fiber foods such as whole grains. High-fiber foods are foods with more than 5 g of fiber per serving.  Pay attention to calories in drinks. Low-calorie drinks include water and unsweetened drinks. The items listed above may not be a complete list of foods and beverages you can eat. Contact a dietitian for more information.   What foods should I limit? Limit foods or drinks that are not good sources of vitamins, minerals, or protein or that are high in unhealthy fats. These include:  Candy.  Other sweets.  Sodas, specialty coffee drinks, alcohol, and juice. The  items listed above may not be a complete list of foods and beverages you should avoid. Contact a dietitian for more information. How do I count calories when eating out?  Pay  attention to portions. Often, portions are much larger when eating out. Try these tips to keep portions smaller: ? Consider sharing a meal instead of getting your own. ? If you get your own meal, eat only half of it. Before you start eating, ask for a container and put half of your meal into it. ? When available, consider ordering smaller portions from the menu instead of full portions.  Pay attention to your food and drink choices. Knowing the way food is cooked and what is included with the meal can help you eat fewer calories. ? If calories are listed on the menu, choose the lower-calorie options. ? Choose dishes that include vegetables, fruits, whole grains, low-fat dairy products, and lean proteins. ? Choose items that are boiled, broiled, grilled, or steamed. Avoid items that are buttered, battered, fried, or served with cream sauce. Items labeled as crispy are usually fried, unless stated otherwise. ? Choose water, low-fat milk, unsweetened iced tea, or other drinks without added sugar. If you want an alcoholic beverage, choose a lower-calorie option, such as a glass of wine or light beer. ? Ask for dressings, sauces, and syrups on the side. These are usually high in calories, so you should limit the amount you eat. ? If you want a salad, choose a garden salad and ask for grilled meats. Avoid extra toppings such as bacon, cheese, or fried items. Ask for the dressing on the side, or ask for olive oil and vinegar or lemon to use as dressing.  Estimate how many servings of a food you are given. Knowing serving sizes will help you be aware of how much food you are eating at restaurants. Where to find more information  Centers for Disease Control and Prevention: FootballExhibition.com.br  [place] Department of Agriculture: WrestlingReporter.dk Summary  Calorie counting means keeping track of how many calories you eat and drink each day. If you eat fewer calories than your body needs, you should lose weight.  A  healthy amount of weight to lose per week is usually 1-2 lb (0.5-0.9 kg). This usually means reducing your daily calorie intake by 500-750 calories.  The number of calories in a food can be found on a Nutrition Facts label. If a food does not have a Nutrition Facts label, try to look up the calories online or ask your dietitian for help.  Use smaller plates, glasses, and bowls for smaller portions and to prevent overeating.  Use your calories on foods and drinks that will fill you up and not leave you hungry shortly after a meal. This information is not intended to replace advice given to you by your health care provider. Make sure you discuss any questions you have with your health care provider. Document Revised: 06/05/2019 Document Reviewed: 06/05/2019 Elsevier Patient Education  2021 [employer].

## 2020-09-07 NOTE — Assessment & Plan Note (Signed)
-  obtain records 

## 2020-09-07 NOTE — Progress Notes (Signed)
New Patient Office Visit  Subjective:  Patient ID: Susan Atkinson, female    DOB: November 16, 1995  Age: 25 y.o. MRN: [id number]  CC:  Chief Complaint  Patient presents with  . New Patient (Initial Visit)    HPI TEXANNA Susan Atkinson presents for new patient visit. Transferring care from Dr. Karie Atkinson. She sees [facility] for GYN needs. Last physical and labs were over a year ago.   She states that she is concerned with weight. She states that she has been working from home and has gained weight.    Past Medical History:  Diagnosis Date  . Medical history non-contributory   . Pregnant 11/17/2014  . Trichimoniasis 05/23/2017   +on pap , treated 05/23/17    Past Surgical History:  Procedure Laterality Date  . NO PAST SURGERIES      History reviewed. No pertinent family history.  Social History   Socioeconomic History  . Marital status: Single    Spouse name: Not on file  . Number of children: 1  . Years of education: Not on file  . Highest education level: Not on file  Occupational History  . Occupation: [employer]    Comment: Marketing executive  Tobacco Use  . Smoking status: Never Smoker  . Smokeless tobacco: Never Used  Vaping Use  . Vaping Use: Never used  Substance and Sexual Activity  . Alcohol use: No  . Drug use: No  . Sexual activity: Yes    Birth control/protection: None  Other Topics Concern  . Not on file  Social History Narrative  . Not on file   Social Determinants of Health   Financial Resource Strain: Low Risk   . Difficulty of Paying Living Expenses: Not hard at all  Food Insecurity: No Food Insecurity  . Worried About Charity fundraiser in the Last Year: Never true  . Ran Out of Food in the Last Year: Never true  Transportation Needs: No Transportation Needs  . Lack of Transportation (Medical): No  . Lack of Transportation (Non-Medical): No  Physical Activity: Insufficiently Active  . Days of Exercise per Week: 3 days  . Minutes of Exercise  per Session: 20 min  Stress: No Stress Concern Present  . Feeling of Stress : Only a little  Social Connections: Moderately Isolated  . Frequency of Communication with Friends and Family: More than three times a week  . Frequency of Social Gatherings with Friends and Family: Three times a week  . Attends Religious Services: More than 4 times per year  . Active Member of Clubs or Organizations: No  . Attends Archivist Meetings: Never  . Marital Status: Never married  Intimate Partner Violence: Not At Risk  . Fear of Current or Ex-Partner: No  . Emotionally Abused: No  . Physically Abused: No  . Sexually Abused: No    ROS Review of Systems  Constitutional: Positive for unexpected weight change. Negative for chills, fatigue and fever.       Weight gain since COVID  Respiratory: Negative.   Cardiovascular: Negative.   Psychiatric/Behavioral: Negative.     Objective:   Today's Vitals: BP 129/84   Pulse (!) 105   Temp 99.2 F (37.3 C)   Resp 20   Ht $R'5\' 8"'KI$  (1.727 m)   Wt 224 lb (101.6 kg)   SpO2 95%   BMI 34.06 kg/m   Physical Exam Constitutional:      Appearance: Normal appearance. She is obese.  Cardiovascular:  Rate and Rhythm: Normal rate and regular rhythm.     Pulses: Normal pulses.     Heart sounds: Normal heart sounds.  Pulmonary:     Effort: Pulmonary effort is normal.     Breath sounds: Normal breath sounds.  Neurological:     Mental Status: She is alert.  Psychiatric:        Mood and Affect: Mood normal.        Behavior: Behavior normal.        Thought Content: Thought content normal.        Judgment: Judgment normal.     Assessment & Plan:   Problem List Items Addressed This Visit      Other   Encounter to establish care    -obtain records      Relevant Orders   CBC with Differential/Platelet   CMP14+EGFR   Lipid Panel With LDL/HDL Ratio   TSH   Screening due    -will screen for HCV with next set of labs      Relevant  Orders   HCV Ab w/Rflx to Verification   Obesity (BMI 30.0-34.9) - Primary    -states she has gained weight since COVID -may consider phentermine at time of physical if labs are good -if phentermine doesn't get results, may consider weight loss mgmt      Relevant Orders   TSH      No outpatient encounter medications on file as of 09/07/2020.   No facility-administered encounter medications on file as of 09/07/2020.    Follow-up: Return in about 3 weeks (around 09/28/2020) for Physical Exam (No PAP).   Noreene Larsson, NP

## 2020-09-07 NOTE — Assessment & Plan Note (Signed)
-  will screen for HCV with next set of labs 

## 2020-09-25 LAB — HEPATITIS C ANTIBODY: Hep C Virus Ab: <0.1 s/co ratio (ref 0.0–0.9)

## 2020-09-25 LAB — TSH: TSH: 2.08 u[IU]/mL (ref 0.450–4.500)

## 2020-09-25 LAB — CMP14+EGFR
ALT: 14 IU/L (ref 0–32)
AST: 14 IU/L (ref 0–40)
Albumin/Globulin Ratio: 1.8 (ref 1.2–2.2)
Albumin: 4.6 g/dL (ref 3.9–5.0)
Alkaline Phosphatase: 64 IU/L (ref 44–121)
BUN/Creatinine Ratio: 10 (ref 9–23)
BUN: 8 mg/dL (ref 6–20)
Bilirubin Total: <0.2 mg/dL (ref 0.0–1.2)
CO2: 22 mmol/L (ref 20–29)
Calcium: 9.6 mg/dL (ref 8.7–10.2)
Chloride: 103 mmol/L (ref 96–106)
Creatinine, Ser: 0.8 mg/dL (ref 0.57–1.00)
Globulin, Total: 2.6 g/dL (ref 1.5–4.5)
Glucose: 89 mg/dL (ref 65–99)
Potassium: 4.3 mmol/L (ref 3.5–5.2)
Sodium: 140 mmol/L (ref 134–144)
Total Protein: 7.2 g/dL (ref 6.0–8.5)
eGFR: 105 mL/min/{1.73_m2} (ref >59)

## 2020-09-25 LAB — CBC WITH DIFFERENTIAL/PLATELET
Basophils Absolute: 0 10*3/uL (ref 0.0–0.2)
Basos: 1 %
EOS (ABSOLUTE): 0.1 10*3/uL (ref 0.0–0.4)
Eos: 2 %
Hematocrit: 40.9 % (ref 34.0–46.6)
Hemoglobin: 13.7 g/dL (ref 11.1–15.9)
Immature Grans (Abs): 0 10*3/uL (ref 0.0–0.1)
Immature Granulocytes: 0 %
Lymphocytes Absolute: 1.4 10*3/uL (ref 0.7–3.1)
Lymphs: 21 %
MCH: 30.2 pg (ref 26.6–33.0)
MCHC: 33.5 g/dL (ref 31.5–35.7)
MCV: 90 fL (ref 79–97)
Monocytes Absolute: 0.6 10*3/uL (ref 0.1–0.9)
Monocytes: 10 %
Neutrophils Absolute: 4.3 10*3/uL (ref 1.4–7.0)
Neutrophils: 66 %
Platelets: 190 10*3/uL (ref 150–450)
RBC: 4.54 x10E6/uL (ref 3.77–5.28)
RDW: 12.2 % (ref 11.7–15.4)
WBC: 6.5 10*3/uL (ref 3.4–10.8)

## 2020-09-25 LAB — LIPID PANEL WITH LDL/HDL RATIO
Cholesterol, Total: 180 mg/dL (ref 100–199)
HDL: 39 mg/dL — ABNORMAL LOW (ref >39)
LDL Chol Calc (NIH): 110 mg/dL — ABNORMAL HIGH (ref 0–99)
LDL/HDL Ratio: 2.8 ratio (ref 0.0–3.2)
Triglycerides: 178 mg/dL — ABNORMAL HIGH (ref 0–149)
VLDL Cholesterol Cal: 31 mg/dL (ref 5–40)

## 2020-09-27 NOTE — Progress Notes (Signed)
LDL and triglycerides are slightly elevated, so she should cut back on fried/fatty foods.

## 2020-09-28 ENCOUNTER — Other Ambulatory Visit: Payer: Self-pay

## 2020-09-28 ENCOUNTER — Encounter: Payer: Self-pay | Admitting: Nurse Practitioner

## 2020-09-28 ENCOUNTER — Ambulatory Visit (INDEPENDENT_AMBULATORY_CARE_PROVIDER_SITE_OTHER): Payer: Managed Care, Other (non HMO) | Admitting: Nurse Practitioner

## 2020-09-28 DIAGNOSIS — E669 Obesity, unspecified: Secondary | ICD-10-CM

## 2020-09-28 DIAGNOSIS — Z0001 Encounter for general adult medical examination with abnormal findings: Secondary | ICD-10-CM | POA: Diagnosis not present

## 2020-09-28 DIAGNOSIS — Z0000 Encounter for general adult medical examination without abnormal findings: Secondary | ICD-10-CM

## 2020-09-28 MED ORDER — PHENTERMINE HCL 15 MG PO CAPS: 15.0000 mg | ORAL_CAPSULE | ORAL | 0 refills | Status: DC

## 2020-09-28 NOTE — Assessment & Plan Note (Addendum)
BMI Readings from Last 3 Encounters:  09/28/20 35.08 kg/m  09/07/20 34.06 kg/m  01/27/20 34.05 kg/m  -Rx. Phentermine; med check in 1 month

## 2020-09-28 NOTE — Progress Notes (Signed)
Established Patient Office Visit  Subjective:  Patient ID: Susan Atkinson, female    DOB: [birth year]  Age: 25 y.o. MRN: [id number]  CC:  Chief Complaint  Patient presents with  . Annual Exam    HPI Susan Atkinson presents for physical exam. She has been concerned with weight gain.   Past Medical History:  Diagnosis Date  . Medical history non-contributory   . Pregnant 11/17/2014  . Trichimoniasis 05/23/2017   +on pap , treated 05/23/17    Past Surgical History:  Procedure Laterality Date  . NO PAST SURGERIES      History reviewed. No pertinent family history.  Social History   Socioeconomic History  . Marital status: Single    Spouse name: Not on file  . Number of children: 1  . Years of education: Not on file  . Highest education level: Not on file  Occupational History  . Occupation: [employer]    Comment: Marketing executive  Tobacco Use  . Smoking status: Never Smoker  . Smokeless tobacco: Never Used  Vaping Use  . Vaping Use: Never used  Substance and Sexual Activity  . Alcohol use: No  . Drug use: No  . Sexual activity: Yes    Birth control/protection: None  Other Topics Concern  . Not on file  Social History Narrative  . Not on file   Social Determinants of Health   Financial Resource Strain: Low Risk   . Difficulty of Paying Living Expenses: Not hard at all  Food Insecurity: No Food Insecurity  . Worried About Charity fundraiser in the Last Year: Never true  . Ran Out of Food in the Last Year: Never true  Transportation Needs: No Transportation Needs  . Lack of Transportation (Medical): No  . Lack of Transportation (Non-Medical): No  Physical Activity: Insufficiently Active  . Days of Exercise per Week: 3 days  . Minutes of Exercise per Session: 20 min  Stress: No Stress Concern Present  . Feeling of Stress : Only a little  Social Connections: Moderately Isolated  . Frequency of Communication with Friends and Family: More than three times  a week  . Frequency of Social Gatherings with Friends and Family: Three times a week  . Attends Religious Services: More than 4 times per year  . Active Member of Clubs or Organizations: No  . Attends Archivist Meetings: Never  . Marital Status: Never married  Intimate Partner Violence: Not At Risk  . Fear of Current or Ex-Partner: No  . Emotionally Abused: No  . Physically Abused: No  . Sexually Abused: No    No outpatient medications prior to visit.   No facility-administered medications prior to visit.    No Known Allergies  ROS Review of Systems  Constitutional: Negative.   HENT: Negative.   Eyes: Negative.   Respiratory: Negative.   Cardiovascular: Negative.   Gastrointestinal: Negative.   Endocrine: Negative.   Genitourinary: Negative.   Musculoskeletal: Negative.   Skin: Negative.   Allergic/Immunologic: Negative.   Neurological: Negative.   Hematological: Negative.   Psychiatric/Behavioral: Negative.       Objective:    Physical Exam Constitutional:      Appearance: Normal appearance. She is obese.  HENT:     Head: Normocephalic and atraumatic.     Right Ear: Tympanic membrane, ear canal and external ear normal.     Left Ear: Tympanic membrane, ear canal and external ear normal.     Nose: Nose normal.  Mouth/Throat:     Mouth: Mucous membranes are moist.     Pharynx: Oropharynx is clear.  Eyes:     Extraocular Movements: Extraocular movements intact.     Conjunctiva/sclera: Conjunctivae normal.     Pupils: Pupils are equal, round, and reactive to light.  Cardiovascular:     Rate and Rhythm: Normal rate and regular rhythm.     Pulses: Normal pulses.     Heart sounds: Normal heart sounds.  Pulmonary:     Effort: Pulmonary effort is normal.     Breath sounds: Normal breath sounds.  Abdominal:     General: Abdomen is flat. Bowel sounds are normal.     Palpations: Abdomen is soft.  Musculoskeletal:        General: Normal range of  motion.     Cervical back: Neck supple.  Skin:    General: Skin is warm and dry.     Capillary Refill: Capillary refill takes less than 2 seconds.  Neurological:     General: No focal deficit present.     Mental Status: She is alert and oriented to person, place, and time.     Cranial Nerves: No cranial nerve deficit.     Sensory: No sensory deficit.     Motor: No weakness.     Coordination: Coordination normal.     Gait: Gait normal.  Psychiatric:        Mood and Affect: Mood normal.        Behavior: Behavior normal.        Thought Content: Thought content normal.        Judgment: Judgment normal.     BP 119/79   Pulse 72   Temp 99.4 F (37.4 C)   Resp 18   Ht 5\' 7"  (1.702 m)   Wt 224 lb (101.6 kg)   SpO2 97%   BMI 35.08 kg/m  Wt Readings from Last 3 Encounters:  09/28/20 224 lb (101.6 kg)  09/07/20 224 lb (101.6 kg)  01/27/20 219 lb (99.3 kg)     Health Maintenance Due  Topic Date Due  . TETANUS/TDAP  Never done  . PAP-Cervical Cytology Screening  05/18/2020  . PAP SMEAR-Modifier  05/18/2020    There are no preventive care reminders to display for this patient.  Lab Results  Component Value Date   TSH 2.080 09/24/2020   Lab Results  Component Value Date   WBC 6.5 09/24/2020   HGB 13.7 09/24/2020   HCT 40.9 09/24/2020   MCV 90 09/24/2020   PLT 190 09/24/2020   Lab Results  Component Value Date   NA 140 09/24/2020   K 4.3 09/24/2020   CO2 22 09/24/2020   GLUCOSE 89 09/24/2020   BUN 8 09/24/2020   CREATININE 0.80 09/24/2020   BILITOT <0.2 09/24/2020   ALKPHOS 64 09/24/2020   AST 14 09/24/2020   ALT 14 09/24/2020   PROT 7.2 09/24/2020   ALBUMIN 4.6 09/24/2020   CALCIUM 9.6 09/24/2020   EGFR 105 09/24/2020   Lab Results  Component Value Date   CHOL 180 09/24/2020   Lab Results  Component Value Date   HDL 39 (L) 09/24/2020   Lab Results  Component Value Date   LDLCALC 110 (H) 09/24/2020   Lab Results  Component Value Date   TRIG  178 (H) 09/24/2020   No results found for: CHOLHDL No results found for: 09/26/2020    Assessment & Plan:   Problem List Items Addressed This Visit  Other   Annual physical exam    -physical exam completed today; unremarkable      Obesity (BMI 30.0-34.9)    BMI Readings from Last 3 Encounters:  09/28/20 35.08 kg/m  09/07/20 34.06 kg/m  01/27/20 34.05 kg/m  -Rx. Phentermine; med check in 1 month        Relevant Medications   phentermine 15 MG capsule      Meds ordered this encounter  Medications  . phentermine 15 MG capsule    Sig: Take 1 capsule (15 mg total) by mouth every morning.    Dispense:  30 capsule    Refill:  0    Follow-up: Return in about 1 month (around 10/29/2020) for Med Check (phentermine).    Noreene Larsson, NP

## 2020-09-28 NOTE — Assessment & Plan Note (Signed)
-  physical exam completed today; unremarkable

## 2020-10-26 ENCOUNTER — Ambulatory Visit: Payer: Managed Care, Other (non HMO) | Admitting: Nurse Practitioner

## 2021-04-19 ENCOUNTER — Other Ambulatory Visit: Payer: Self-pay

## 2021-04-19 ENCOUNTER — Ambulatory Visit
Admission: EM | Admit: 2021-04-19 | Discharge: 2021-04-19 | Disposition: A | Discharge status: Home / Self Care | Payer: Managed Care, Other (non HMO) | Attending: Family Medicine | Admitting: Family Medicine

## 2021-04-19 DIAGNOSIS — H00011 Hordeolum externum right upper eyelid: Secondary | ICD-10-CM | POA: Diagnosis not present

## 2021-04-19 MED ORDER — CEPHALEXIN 500 MG PO CAPS: 500.0000 mg | ORAL_CAPSULE | Freq: Two times a day (BID) | ORAL | 0 refills | Status: DC

## 2021-04-19 NOTE — ED Provider Notes (Signed)
[facility]    CSN: [id number] Arrival date & time: 04/19/21  0805      History   Chief Complaint Chief Complaint  Patient presents with   Eye Pain    HPI Susan Atkinson is a 25 y.o. female.   Patient presenting today with 1 week history of right upper eyelid redness, swelling.  She states that she initially could see a very obvious stye on the lash line so she started using warm compresses and some erythromycin eye ointment that she had from a previous stye.  She states that it got slightly better for period of time but yesterday started swelling back up and becoming more painful.  She denies current drainage, fevers, conjunctival redness or pain, headache, new make-up or facial products, stings or injuries to the area.   Past Medical History:  Diagnosis Date   Medical history non-contributory    Pregnant 11/17/2014   Trichimoniasis 05/23/2017   +on pap , treated 05/23/17    Patient Active Problem List   Diagnosis Date Noted   Screening due 09/07/2020   Obesity (BMI 30.0-34.9) 09/07/2020   Annual physical exam 01/27/2020   Encounter to establish care 01/27/2020    Past Surgical History:  Procedure Laterality Date   NO PAST SURGERIES      OB History     Gravida  1   Para  1   Term  1   Preterm      AB      Living  1      SAB      IAB      Ectopic      Multiple  0   Live Births  1            Home Medications    Prior to Admission medications   Medication Sig Start Date End Date Taking? Authorizing Provider  cephALEXin (KEFLEX) 500 MG capsule Take 1 capsule (500 mg total) by mouth 2 (two) times daily. 04/19/21  Yes Particia Nearing, PA-C  phentermine 15 MG capsule Take 1 capsule (15 mg total) by mouth every morning. 09/28/20   Heather Roberts, NP    Family History History reviewed. No pertinent family history.  Social History Social History   Tobacco Use   Smoking status: Never   Smokeless tobacco: Never  Vaping  Use   Vaping Use: Never used  Substance Use Topics   Alcohol use: No   Drug use: No     Allergies   Patient has no known allergies.   Review of Systems Review of Systems Per HPI  Physical Exam Triage Vital Signs ED Triage Vitals  Enc Vitals Group     BP 04/19/21 0821 122/82     Pulse Rate 04/19/21 0821 83     Resp 04/19/21 0821 16     Temp 04/19/21 0821 98 F (36.7 C)     Temp Source 04/19/21 0821 Oral     SpO2 04/19/21 0821 97 %     Weight --      Height --      Head Circumference --      Peak Flow --      Pain Score 04/19/21 0832 3     Pain Loc --      Pain Edu? --      Excl. in GC? --    No data found.  Updated Vital Signs BP 122/82 (BP Location: Right Arm)   Pulse 83   Temp  98 F (36.7 C) (Oral)   Resp 16   SpO2 97%   Visual Acuity Right Eye Distance:   Left Eye Distance:   Bilateral Distance:    Right Eye Near:   Left Eye Near:    Bilateral Near:     Physical Exam Vitals and nursing note reviewed.  Constitutional:      Appearance: Normal appearance. She is not ill-appearing.  HENT:     Head: Atraumatic.     Mouth/Throat:     Mouth: Mucous membranes are moist.  Eyes:     Extraocular Movements: Extraocular movements intact.     Conjunctiva/sclera: Conjunctivae normal.     Pupils: Pupils are equal, round, and reactive to light.     Comments: Right upper eyelid erythematous from medial lash line, stye present on lash line.  No active drainage, nonfluctuant  Cardiovascular:     Rate and Rhythm: Normal rate and regular rhythm.     Heart sounds: Normal heart sounds.  Pulmonary:     Effort: Pulmonary effort is normal.     Breath sounds: Normal breath sounds.  Musculoskeletal:        General: Normal range of motion.     Cervical back: Normal range of motion and neck supple.  Skin:    General: Skin is warm and dry.  Neurological:     Mental Status: She is alert and oriented to person, place, and time.  Psychiatric:        Mood and Affect:  Mood normal.        Thought Content: Thought content normal.        Judgment: Judgment normal.     UC Treatments / Results  Labs (all labs ordered are listed, but only abnormal results are displayed) Labs Reviewed - No data to display  EKG   Radiology No results found.  Procedures Procedures (including critical care time)  Medications Ordered in UC Medications - No data to display  Initial Impression / Assessment and Plan / UC Course  I have reviewed the triage vital signs and the nursing notes.  Pertinent labs & imaging results that were available during my care of the patient were reviewed by me and considered in my medical decision making (see chart for details).     Given resistance to topical antibiotic ointment and warm compresses, will start oral Keflex in addition to current regimen.  Discussed over-the-counter pain relievers, good hand hygiene additionally.  Final Clinical Impressions(s) / UC Diagnoses   Final diagnoses:  Hordeolum externum of right upper eyelid   Discharge Instructions   None    ED Prescriptions     Medication Sig Dispense Auth. Provider   cephALEXin (KEFLEX) 500 MG capsule Take 1 capsule (500 mg total) by mouth 2 (two) times daily. 14 capsule Particia Nearing, [place]      PDMP not reviewed this encounter.   Roosvelt Maser [place], [place] 04/19/21 [phone]

## 2021-04-19 NOTE — ED Triage Notes (Signed)
Pt presents with right upper eye lid redness and swelling for past week

## 2021-11-18 ENCOUNTER — Encounter: Payer: Self-pay | Admitting: Adult Health

## 2021-11-18 ENCOUNTER — Ambulatory Visit: Payer: Managed Care, Other (non HMO) | Admitting: Adult Health

## 2021-11-18 ENCOUNTER — Other Ambulatory Visit (HOSPITAL_COMMUNITY)
Admission: RE | Admit: 2021-11-18 | Discharge: 2021-11-18 | Disposition: A | Discharge status: Home / Self Care | Payer: Managed Care, Other (non HMO) | Source: Ambulatory Visit | Attending: Adult Health | Admitting: Adult Health

## 2021-11-18 VITALS — BP 118/82 | HR 79 | Ht 68.0 in | Wt 223.0 lb

## 2021-11-18 DIAGNOSIS — Z01419 Encounter for gynecological examination (general) (routine) without abnormal findings: Secondary | ICD-10-CM | POA: Diagnosis not present

## 2021-11-18 DIAGNOSIS — Z3009 Encounter for other general counseling and advice on contraception: Secondary | ICD-10-CM | POA: Diagnosis present

## 2021-11-18 NOTE — Progress Notes (Signed)
Patient ID: Susan Atkinson, female   DOB: Sep 23, 1995, 26 y.o.   MRN: [id number] History of Present Illness:  Susan Atkinson is a 26 year old black female,single, G1P1 in for a well woman gyn exam and pap.  Current Medications, Allergies, Past Medical History, Past Surgical History, Family History and Social History were reviewed in [employer] record.     Review of Systems:  Patient denies any headaches, hearing loss, fatigue, blurred vision, shortness of breath, chest pain, abdominal pain, problems with bowel movements, urination, or intercourse. No joint pain or mood swings.    Physical Exam:BP 118/82 (BP Location: Left Arm, Patient Position: Sitting, Cuff Size: Large)   Pulse 79   Ht 5\' 8"  (1.727 m)   Wt 223 lb (101.2 kg)   LMP 11/05/2021   BMI 33.91 kg/m   General:  Well developed, well nourished, no acute distress Skin:  Warm and dry Neck:  Midline trachea, normal thyroid, good ROM, no lymphadenopathy Lungs; Clear to auscultation bilaterally Breast:  No dominant palpable mass, retraction, or nipple discharge Cardiovascular: Regular rate and rhythm Abdomen:  Soft, non tender, no hepatosplenomegaly Pelvic:  External genitalia is normal in appearance, no lesions.  The vagina is normal in appearance. Urethra has no lesions or masses. The cervix is bulbous. Pap with GC/CHL and HR HPV genotyping performed. Uterus is felt to be normal size, shape, and contour.  No adnexal masses or tenderness noted.Bladder is non tender, no masses felt. Rectal:Deferred Extremities/musculoskeletal:  No swelling or varicosities noted, no clubbing or cyanosis Psych:  No mood changes, alert and cooperative,seems happy AA is 1 Fall risk is low    11/18/2021    8:39 AM 09/28/2020    8:08 AM 09/07/2020    1:36 PM  Depression screen PHQ 2/9  Decreased Interest 0 0 0  Down, Depressed, Hopeless 0 0 0  PHQ - 2 Score 0 0 0  Altered sleeping 0    Tired, decreased energy 0    Change in  appetite 0    Feeling bad or failure about yourself  0    Trouble concentrating 0    Moving slowly or fidgety/restless 0    Suicidal thoughts 0    PHQ-9 Score 0         11/18/2021    8:39 AM 01/27/2020    8:40 AM  GAD 7 : Generalized Anxiety Score  Nervous, Anxious, on Edge 0 0  Control/stop worrying 0 0  Worry too much - different things 0 0  Trouble relaxing 0 0  Restless 0 0  Easily annoyed or irritable 0 0  Afraid - awful might happen 0 0  Total GAD 7 Score 0 0    Examination chaperoned by 01/29/2020 LPN   Impression and Plan:  1. Encounter for gynecological examination with Papanicolaou smear of cervix Pap sent Physical in 1 year Pap in 3 years if normal

## 2021-11-24 LAB — CYTOLOGY - PAP
Chlamydia: NEGATIVE
Comment: NEGATIVE
Comment: NEGATIVE
Comment: NORMAL
Diagnosis: NEGATIVE
Diagnosis: REACTIVE
High risk HPV: NEGATIVE
Neisseria Gonorrhea: NEGATIVE

## 2021-11-25 ENCOUNTER — Other Ambulatory Visit: Payer: Self-pay | Admitting: Adult Health

## 2021-11-25 MED ORDER — METRONIDAZOLE 500 MG PO TABS: 500.0000 mg | ORAL_TABLET | Freq: Two times a day (BID) | ORAL | 0 refills | Status: DC

## 2023-03-15 ENCOUNTER — Ambulatory Visit: Payer: Managed Care, Other (non HMO) | Admitting: Adult Health

## 2023-03-15 ENCOUNTER — Encounter: Payer: Self-pay | Admitting: Adult Health

## 2023-03-15 ENCOUNTER — Other Ambulatory Visit (HOSPITAL_COMMUNITY)
Admission: RE | Admit: 2023-03-15 | Discharge: 2023-03-15 | Disposition: A | Discharge status: Home / Self Care | Payer: Managed Care, Other (non HMO) | Source: Ambulatory Visit | Attending: Adult Health | Admitting: Adult Health

## 2023-03-15 VITALS — BP 119/79 | HR 77 | Ht 67.5 in | Wt 219.0 lb

## 2023-03-15 DIAGNOSIS — Z113 Encounter for screening for infections with a predominantly sexual mode of transmission: Secondary | ICD-10-CM | POA: Insufficient documentation

## 2023-03-15 DIAGNOSIS — Z01419 Encounter for gynecological examination (general) (routine) without abnormal findings: Secondary | ICD-10-CM | POA: Insufficient documentation

## 2023-03-15 NOTE — Progress Notes (Signed)
Patient ID: Susan Atkinson, female   DOB: 1996/05/06, 27 y.o.   MRN: [id number] History of Present Illness: Susan Atkinson is a 27 year old black female, single, G1P1001 in for a well woman gyn exam and requests STD testing.     Component Value Date/Time   DIAGPAP  11/18/2021 0842    - Negative for Intraepithelial Lesions or Malignancy (NILM)   DIAGPAP - Benign reactive/reparative changes 11/18/2021 0842   DIAGPAP  05/18/2017 0000    NEGATIVE FOR INTRAEPITHELIAL LESIONS OR MALIGNANCY.   DIAGPAP TRICHOMONAS VAGINALIS PRESENT. 05/18/2017 0000   HPVHIGH Negative 11/18/2021 0842   ADEQPAP  11/18/2021 0842    Satisfactory for evaluation; transformation zone component PRESENT.   ADEQPAP  05/18/2017 0000    Satisfactory for evaluation  endocervical/transformation zone component PRESENT.     Current Medications, Allergies, Past Medical History, Past Surgical History, Family History and Social History were reviewed in Susan Atkinson record.     Review of Systems: Patient denies any headaches, hearing loss, fatigue, blurred vision, shortness of breath, chest pain, abdominal pain, problems with bowel movements, urination, or intercourse. No joint pain or mood swings.  Denies any discharge, itching or burning Happy with condoms   Physical Exam:BP 119/79 (BP Location: Right Arm, Patient Position: Sitting, Cuff Size: Normal)   Pulse 77   Ht 5' 7.5" (1.715 m)   Wt 219 lb (99.3 kg)   LMP 02/28/2023   BMI 33.79 kg/m   General:  Well developed, well nourished, no acute distress Skin:  Warm and dry Neck:  Midline trachea, normal thyroid, good ROM, no lymphadenopathy Lungs; Clear to auscultation bilaterally Breast:  No dominant palpable mass, retraction, or nipple discharge Cardiovascular: Regular rate and rhythm Abdomen:  Soft, non tender, no hepatosplenomegaly Pelvic:  External genitalia is normal in appearance, no lesions.  The vagina is normal in appearance. Urethra has no  lesions or masses. The cervix is bulbous.CV swab obtained.  Uterus is felt to be normal size, shape, and contour.  No adnexal masses or tenderness noted.Bladder is non tender, no masses felt. Extremities/musculoskeletal:  No swelling or varicosities noted, no clubbing or cyanosis Psych:  No mood changes, alert and cooperative,seems happy AA is 2 Fall risk is low    03/15/2023    9:29 AM 11/18/2021    8:39 AM 09/28/2020    8:08 AM  Depression screen PHQ 2/9  Decreased Interest 0 0 0  Down, Depressed, Hopeless 0 0 0  PHQ - 2 Score 0 0 0  Altered sleeping 0 0   Tired, decreased energy 0 0   Change in appetite 0 0   Feeling bad or failure about yourself  0 0   Trouble concentrating 0 0   Moving slowly or fidgety/restless 0 0   Suicidal thoughts 0 0   PHQ-9 Score 0 0        03/15/2023    9:30 AM 11/18/2021    8:39 AM 01/27/2020    8:40 AM  GAD 7 : Generalized Anxiety Score  Nervous, Anxious, on Edge 0 0 0  Control/stop worrying 0 0 0  Worry too much - different things 0 0 0  Trouble relaxing 0 0 0  Restless 0 0 0  Easily annoyed or irritable 0 0 0  Afraid - awful might happen 0 0 0  Total GAD 7 Score 0 0 0    Upstream - 03/15/23 1478       Pregnancy Intention Screening   Does the patient  want to become pregnant in the next year? No    Does the patient's partner want to become pregnant in the next year? No    Would the patient like to discuss contraceptive options today? No      Contraception Wrap Up   Current Method Female Condom    End Method Female Condom              Examination chaperoned by Malachy Mood LPN   Impression and Plan: 1. Encounter for well woman exam with routine gynecological exam Pap in 206 Physical in 1 year   2. Screening examination for STD (sexually transmitted disease) CV swab sent for GC/CHL,trich BV and yeast Check HIV and RPR  - HIV Antibody (routine testing w rflx) - RPR - Cervicovaginal ancillary only( [place])

## 2023-03-16 ENCOUNTER — Other Ambulatory Visit: Payer: Self-pay | Admitting: Adult Health

## 2023-03-16 LAB — CERVICOVAGINAL ANCILLARY ONLY
Bacterial Vaginitis (gardnerella): POSITIVE — AB
Candida Glabrata: NEGATIVE
Candida Vaginitis: NEGATIVE
Chlamydia: NEGATIVE
Comment: NEGATIVE
Comment: NEGATIVE
Comment: NEGATIVE
Comment: NEGATIVE
Comment: NEGATIVE
Comment: NORMAL
Neisseria Gonorrhea: NEGATIVE
Trichomonas: NEGATIVE

## 2023-03-16 LAB — RPR: RPR Ser Ql: NONREACTIVE

## 2023-03-16 LAB — HIV ANTIBODY (ROUTINE TESTING W REFLEX): HIV Screen 4th Generation wRfx: NONREACTIVE

## 2023-03-16 MED ORDER — METRONIDAZOLE 500 MG PO TABS: 500.0000 mg | ORAL_TABLET | Freq: Two times a day (BID) | ORAL | 0 refills | Status: DC

## 2023-03-16 NOTE — Progress Notes (Signed)
+  BV on vaginal swab, will rx flagyl, no sex or alcohol while taking  

## 2023-09-28 ENCOUNTER — Encounter: Payer: Self-pay | Admitting: Family Medicine

## 2023-09-28 ENCOUNTER — Ambulatory Visit (INDEPENDENT_AMBULATORY_CARE_PROVIDER_SITE_OTHER): Admitting: Family Medicine

## 2023-09-28 VITALS — BP 136/82 | HR 86 | Resp 16 | Ht 68.0 in | Wt 220.1 lb

## 2023-09-28 DIAGNOSIS — E782 Mixed hyperlipidemia: Secondary | ICD-10-CM

## 2023-09-28 DIAGNOSIS — Z0001 Encounter for general adult medical examination with abnormal findings: Secondary | ICD-10-CM

## 2023-09-28 DIAGNOSIS — R7301 Impaired fasting glucose: Secondary | ICD-10-CM

## 2023-09-28 DIAGNOSIS — E559 Vitamin D deficiency, unspecified: Secondary | ICD-10-CM | POA: Diagnosis not present

## 2023-09-28 DIAGNOSIS — E538 Deficiency of other specified B group vitamins: Secondary | ICD-10-CM

## 2023-09-28 DIAGNOSIS — E038 Other specified hypothyroidism: Secondary | ICD-10-CM

## 2023-09-28 DIAGNOSIS — Z0000 Encounter for general adult medical examination without abnormal findings: Secondary | ICD-10-CM

## 2023-09-28 NOTE — Assessment & Plan Note (Signed)

## 2023-09-28 NOTE — Patient Instructions (Signed)

## 2023-09-28 NOTE — Progress Notes (Signed)
 Complete physical exam  Patient: Susan Atkinson   DOB: Mar 30, 1996   28 y.o. Female  MRN: [id number]  Subjective:     Chief Complaint  Patient presents with   Establish Care    Susan Atkinson is a 28 y.o. female who presents today for a complete physical exam. She reports consuming a general diet. Soft ball practice twice a week and attend games regular  She generally feels well. She reports sleeping well. She does have additional problems to discuss today.    Most recent fall risk assessment:    09/28/2023    3:32 PM  Fall Risk   Falls in the past year? 0  Number falls in past yr: 0  Injury with Fall? 0  Follow up Falls evaluation completed     Most recent depression screenings:    09/28/2023    3:32 PM 03/15/2023    9:29 AM  PHQ 2/9 Scores  PHQ - 2 Score 0 0  PHQ- 9 Score  0    Vision:Within last year and Dental: No current dental problems and Receives regular dental care  Patient Care Team: Del Amber Bail, Rogerio Clay, FNP as PCP - General (Family Medicine)   Outpatient Medications Prior to Visit  Medication Sig   [DISCONTINUED] metroNIDAZOLE  (FLAGYL ) 500 MG tablet Take 1 tablet (500 mg total) by mouth 2 (two) times daily.   No facility-administered medications prior to visit.    Review of Systems  Constitutional:  Negative for chills and fever.  Eyes:  Negative for blurred vision.  Respiratory:  Negative for shortness of breath.   Cardiovascular:  Negative for chest pain.  Gastrointestinal:  Negative for abdominal pain.  Genitourinary:  Negative for dysuria.  Musculoskeletal:  Negative for back pain.  Neurological:  Negative for dizziness and headaches.  Psychiatric/Behavioral:  The patient is not nervous/anxious.        Objective:    BP 136/82   Pulse 86   Resp 16   Ht 5\' 8"  (1.727 m)   Wt 220 lb 1.9 oz (99.8 kg)   SpO2 98%   BMI 33.47 kg/m  BP Readings from Last 3 Encounters:  09/28/23 136/82  03/15/23 119/79  11/18/21 118/82       Physical Exam Vitals reviewed.  Constitutional:      General: She is not in acute distress.    Appearance: Normal appearance. She is not ill-appearing, toxic-appearing or diaphoretic.  HENT:     Head: Normocephalic.     Right Ear: Tympanic membrane normal.     Left Ear: Tympanic membrane normal.     Mouth/Throat:     Mouth: Mucous membranes are moist.  Eyes:     General:        Right eye: No discharge.        Left eye: No discharge.     Conjunctiva/sclera: Conjunctivae normal.     Pupils: Pupils are equal, round, and reactive to light.  Cardiovascular:     Rate and Rhythm: Normal rate.     Pulses: Normal pulses.     Heart sounds: Normal heart sounds.  Pulmonary:     Effort: Pulmonary effort is normal. No respiratory distress.     Breath sounds: Normal breath sounds.  Abdominal:     General: Bowel sounds are normal.     Palpations: Abdomen is soft.     Tenderness: There is no abdominal tenderness. There is no right CVA tenderness, left CVA tenderness or guarding.  Musculoskeletal:  General: Normal range of motion.     Cervical back: Normal range of motion.  Skin:    General: Skin is warm and dry.     Capillary Refill: Capillary refill takes less than 2 seconds.  Neurological:     Mental Status: She is alert.     Coordination: Coordination normal.     Gait: Gait normal.  Psychiatric:        Mood and Affect: Mood normal.        Behavior: Behavior normal.      No results found for any visits on 09/28/23.    Assessment & Plan:    Routine Health Maintenance and Physical Exam  Immunization History  Administered Date(s) Administered   Influenza,inj,Quad PF,6+ Mos 02/04/2015   Moderna Sars-Covid-2 Vaccination 02/08/2020, 03/07/2020    Health Maintenance  Topic Date Due   DTaP/Tdap/Td (1 - Tdap) Never done   COVID-19 Vaccine (3 - 2024-25 season) 01/07/2023   INFLUENZA VACCINE  12/07/2023   Cervical Cancer Screening (Pap smear)  11/18/2024   Hepatitis C  Screening  Completed   HIV Screening  Completed   HPV VACCINES  Aged Out   Meningococcal B Vaccine  Aged Out    Discussed health benefits of physical activity, and encouraged her to engage in regular exercise appropriate for her age and condition.  TSH (thyroid-stimulating hormone deficiency) -     TSH + free T4  Mixed hyperlipidemia -     Lipid panel -     CMP14+EGFR -     CBC with Differential/Platelet  Vitamin D deficiency -     VITAMIN D 25 Hydroxy (Vit-D Deficiency, Fractures)  IFG (impaired fasting glucose) -     Hemoglobin A1c  Vitamin B12 deficiency -     Vitamin B12    Return in about 1 year (around 09/27/2024), or if symptoms worsen or fail to improve, for Annual Physical.     Avelino Lek Amber Bail, FNP

## 2023-09-29 LAB — CBC WITH DIFFERENTIAL/PLATELET
Basophils Absolute: 0 10*3/uL (ref 0.0–0.2)
Basos: 0 %
EOS (ABSOLUTE): 0.1 10*3/uL (ref 0.0–0.4)
Eos: 2 %
Hematocrit: 41.9 % (ref 34.0–46.6)
Hemoglobin: 14 g/dL (ref 11.1–15.9)
Immature Grans (Abs): 0 10*3/uL (ref 0.0–0.1)
Immature Granulocytes: 0 %
Lymphocytes Absolute: 1.7 10*3/uL (ref 0.7–3.1)
Lymphs: 23 %
MCH: 31 pg (ref 26.6–33.0)
MCHC: 33.4 g/dL (ref 31.5–35.7)
MCV: 93 fL (ref 79–97)
Monocytes Absolute: 0.7 10*3/uL (ref 0.1–0.9)
Monocytes: 10 %
Neutrophils Absolute: 4.8 10*3/uL (ref 1.4–7.0)
Neutrophils: 65 %
Platelets: 218 10*3/uL (ref 150–450)
RBC: 4.52 x10E6/uL (ref 3.77–5.28)
RDW: 12.4 % (ref 11.7–15.4)
WBC: 7.3 10*3/uL (ref 3.4–10.8)

## 2023-09-29 LAB — CMP14+EGFR
ALT: 22 IU/L (ref 0–32)
AST: 19 IU/L (ref 0–40)
Albumin: 4.6 g/dL (ref 4.0–5.0)
Alkaline Phosphatase: 67 IU/L (ref 44–121)
BUN/Creatinine Ratio: 9 (ref 9–23)
BUN: 7 mg/dL (ref 6–20)
Bilirubin Total: 0.3 mg/dL (ref 0.0–1.2)
CO2: 22 mmol/L (ref 20–29)
Calcium: 9.9 mg/dL (ref 8.7–10.2)
Chloride: 104 mmol/L (ref 96–106)
Creatinine, Ser: 0.79 mg/dL (ref 0.57–1.00)
Globulin, Total: 2.9 g/dL (ref 1.5–4.5)
Glucose: 89 mg/dL (ref 70–99)
Potassium: 5 mmol/L (ref 3.5–5.2)
Sodium: 139 mmol/L (ref 134–144)
Total Protein: 7.5 g/dL (ref 6.0–8.5)
eGFR: 104 mL/min/{1.73_m2} (ref >59)

## 2023-09-29 LAB — VITAMIN B12: Vitamin B-12: 654 pg/mL (ref 232–1245)

## 2023-09-29 LAB — HEMOGLOBIN A1C
Est. average glucose Bld gHb Est-mCnc: 108 mg/dL
Hgb A1c MFr Bld: 5.4 % (ref 4.8–5.6)

## 2023-09-29 LAB — LIPID PANEL
Chol/HDL Ratio: 4.5 ratio — ABNORMAL HIGH (ref 0.0–4.4)
Cholesterol, Total: 199 mg/dL (ref 100–199)
HDL: 44 mg/dL (ref >39)
LDL Chol Calc (NIH): 113 mg/dL — ABNORMAL HIGH (ref 0–99)
Triglycerides: 240 mg/dL — ABNORMAL HIGH (ref 0–149)
VLDL Cholesterol Cal: 42 mg/dL — ABNORMAL HIGH (ref 5–40)

## 2023-09-29 LAB — VITAMIN D 25 HYDROXY (VIT D DEFICIENCY, FRACTURES): Vit D, 25-Hydroxy: 22.2 ng/mL — ABNORMAL LOW (ref 30.0–100.0)

## 2023-09-29 LAB — TSH+FREE T4
Free T4: 1.1 ng/dL (ref 0.82–1.77)
TSH: 1.08 u[IU]/mL (ref 0.450–4.500)

## 2023-10-02 ENCOUNTER — Ambulatory Visit: Payer: Self-pay | Admitting: Family Medicine

## 2024-03-14 ENCOUNTER — Ambulatory Visit: Admitting: Adult Health

## 2024-04-08 ENCOUNTER — Ambulatory Visit: Admitting: Women's Health

## 2024-04-08 ENCOUNTER — Encounter: Payer: Self-pay | Admitting: Women's Health

## 2024-04-08 ENCOUNTER — Other Ambulatory Visit (HOSPITAL_COMMUNITY)
Admission: RE | Admit: 2024-04-08 | Discharge: 2024-04-08 | Disposition: A | Source: Ambulatory Visit | Attending: Women's Health | Admitting: Women's Health

## 2024-04-08 VITALS — BP 122/84 | HR 93 | Ht 68.0 in | Wt 218.0 lb

## 2024-04-08 DIAGNOSIS — Z113 Encounter for screening for infections with a predominantly sexual mode of transmission: Secondary | ICD-10-CM | POA: Diagnosis present

## 2024-04-08 DIAGNOSIS — Z01419 Encounter for gynecological examination (general) (routine) without abnormal findings: Secondary | ICD-10-CM | POA: Diagnosis not present

## 2024-04-08 DIAGNOSIS — K649 Unspecified hemorrhoids: Secondary | ICD-10-CM

## 2024-04-08 MED ORDER — HYDROCORTISONE ACETATE 25 MG RE SUPP: 25.0000 mg | Freq: Two times a day (BID) | RECTAL | 6 refills | Status: AC

## 2024-04-08 NOTE — Progress Notes (Signed)
 WELL-WOMAN EXAMINATION Patient name: Susan Atkinson MRN [id number]  Date of birth: Sep 19, 1995 Chief Complaint:   Annual Exam  History of Present Illness:   Susan Atkinson is a 28 y.o. G51P1001 African-American female being seen today for a routine well-woman exam.  Current complaints: hemorrhoids bother her some, otc meds not helping. Not constipated  PCP: [facility]      Does desire CV swab/STD screen Patient's last menstrual period was 03/17/2024. The current method of family planning is condoms, is ok w/ this method right now  Last pap 11/18/21. Results were: NILM w/ HRHPV negative. H/O abnormal pap: no Last mammogram: never. Results were: N/A. Family h/o breast cancer: no Last colonoscopy: never. Results were: N/A. Family h/o colorectal cancer: no     04/08/2024   10:01 AM 09/28/2023    3:32 PM 03/15/2023    9:29 AM 11/18/2021    8:39 AM 09/28/2020    8:08 AM  Depression screen PHQ 2/9  Decreased Interest 0 0 0 0 0  Down, Depressed, Hopeless 1 0 0 0 0  PHQ - 2 Score 1 0 0 0 0  Altered sleeping 0  0 0   Tired, decreased energy 1  0 0   Change in appetite 0  0 0   Feeling bad or failure about yourself  0  0 0   Trouble concentrating 0  0 0   Moving slowly or fidgety/restless 0  0 0   Suicidal thoughts 0  0 0   PHQ-9 Score 2  0  0       Data saved with a previous flowsheet row definition        04/08/2024   10:02 AM 09/28/2023    3:33 PM 03/15/2023    9:30 AM 11/18/2021    8:39 AM  GAD 7 : Generalized Anxiety Score  Nervous, Anxious, on Edge 0 0 0 0  Control/stop worrying 0 0 0 0  Worry too much - different things 1 0 0 0  Trouble relaxing 0 0 0 0  Restless 0 0 0 0  Easily annoyed or irritable 1 0 0 0  Afraid - awful might happen 0 0 0 0  Total GAD 7 Score 2 0 0 0  Anxiety Difficulty  Not difficult at all       Review of Systems:   Pertinent items are noted in HPI Denies any headaches, blurred vision, fatigue, shortness of breath, chest pain, abdominal pain, abnormal  vaginal discharge/itching/odor/irritation, problems with periods, bowel movements, urination, or intercourse unless otherwise stated above. Pertinent History Reviewed:  Reviewed past medical,surgical, social and family history.  Reviewed problem list, medications and allergies. Physical Assessment:   Vitals:   04/08/24 0959  BP: 122/84  Pulse: 93  Weight: 218 lb (98.9 kg)  Height: 5' 8 (1.727 m)  Body mass index is 33.15 kg/m.        Physical Examination:   General appearance - well appearing, and in no distress  Mental status - alert, oriented to person, place, and time  Psych:  She has a normal mood and affect  Skin - warm and dry, normal color, no suspicious lesions noted  Chest - effort normal, all lung fields clear to auscultation bilaterally  Heart - normal rate and regular rhythm  Neck:  midline trachea, no thyromegaly or nodules  Breasts - breasts appear normal, no suspicious masses, no skin or nipple changes or  axillary nodes  Abdomen - soft, nontender, nondistended, no masses  or organomegaly  Pelvic - VULVA: normal appearing vulva with no masses, tenderness or lesions  VAGINA: normal appearing vagina with normal color and discharge, no lesions  CERVIX: normal appearing cervix without discharge or lesions, no CMT  Rectal: small external non-thrombosed hemorrhoid  Thin prep pap is not done   UTERUS: uterus is felt to be normal size, shape, consistency and nontender   ADNEXA: No adnexal masses or tenderness noted.  Extremities:  No swelling or varicosities noted  Chaperone: Peggy Dones  No results found for this or any previous visit (from the past 24 hours).  Assessment & Plan:  1) Well-Woman Exam  2) STD screen  3) Hemorrhoid> otc meds not helping, rx anusol supp  Labs/procedures today: CV swab  Mammogram: @ 29yo, or sooner if problems Colonoscopy: @ 29yo, or sooner if problems  No orders of the defined types were placed in this encounter.   Meds:  Meds  ordered this encounter  Medications   hydrocortisone (ANUSOL-HC) 25 MG suppository    Sig: Place 1 suppository (25 mg total) rectally 2 (two) times daily.    Dispense:  12 suppository    Refill:  6    Follow-up: Return in about 1 year (around 04/08/2025) for Pap & physical.  Susan Atkinson CNM, [facility] 04/08/2024 10:49 AM

## 2024-04-10 ENCOUNTER — Ambulatory Visit: Payer: Self-pay | Admitting: Women's Health

## 2024-04-10 LAB — CERVICOVAGINAL ANCILLARY ONLY
Bacterial Vaginitis (gardnerella): POSITIVE — AB
Candida Glabrata: NEGATIVE
Candida Vaginitis: NEGATIVE
Chlamydia: NEGATIVE
Comment: NEGATIVE
Comment: NEGATIVE
Comment: NEGATIVE
Comment: NEGATIVE
Comment: NEGATIVE
Comment: NORMAL
Neisseria Gonorrhea: NEGATIVE
Trichomonas: NEGATIVE

## 2024-04-10 MED ORDER — METRONIDAZOLE 500 MG PO TABS: 500.0000 mg | ORAL_TABLET | Freq: Two times a day (BID) | ORAL | 0 refills | Status: DC

## 2024-05-25 ENCOUNTER — Encounter: Payer: Self-pay | Admitting: Emergency Medicine

## 2024-05-25 ENCOUNTER — Ambulatory Visit
Admission: EM | Admit: 2024-05-25 | Discharge: 2024-05-25 | Disposition: A | Discharge status: Home / Self Care | Attending: Nurse Practitioner | Admitting: Nurse Practitioner

## 2024-05-25 DIAGNOSIS — J209 Acute bronchitis, unspecified: Secondary | ICD-10-CM | POA: Diagnosis not present

## 2024-05-25 MED ORDER — PREDNISONE 20 MG PO TABS: 40.0000 mg | ORAL_TABLET | Freq: Every day | ORAL | 0 refills | Status: AC

## 2024-05-25 MED ORDER — PROMETHAZINE-DM 6.25-15 MG/5ML PO SYRP: 5.0000 mL | ORAL_SOLUTION | Freq: Four times a day (QID) | ORAL | 0 refills | Status: AC | PRN

## 2024-05-25 MED ORDER — ALBUTEROL SULFATE HFA 108 (90 BASE) MCG/ACT IN AERS: 2.0000 | INHALATION_SPRAY | Freq: Four times a day (QID) | RESPIRATORY_TRACT | 0 refills | Status: AC | PRN

## 2024-05-25 NOTE — Discharge Instructions (Signed)
 Take medication as prescribed. Increase fluids and allow for plenty of rest. You may take over-the-counter Tylenol  or ibuprofen  as needed for pain, fever, or general discomfort. Recommend the use of a humidifier in your bedroom at nighttime during sleep and sleeping elevated on pillows while symptoms persist. As discussed, your cough may linger from days to weeks.  If you continue to have a persistent nagging cough, but are generally feeling well, continue over-the-counter cough medications, cough drops, and fluids.  Seek care if you develop worsening wheezing, develop shortness of breath, difficulty breathing, fever, or other concerns. Follow-up as needed.

## 2024-05-25 NOTE — ED Provider Notes (Signed)
 " [facility]    CSN: [id number] Arrival date & time: 05/25/24  9165      History   Chief Complaint No chief complaint on file.   HPI Susan Atkinson is a 29 y.o. female.   The history is provided by the patient.   Patient presents for a 2-week history of cough and chest discomfort.  Patient states that she had upper respiratory symptoms, but her cough has continued to linger.  Patient states cough becomes more persistent at night, states the cough is a dry cough.  She complains of pain in the right side of her chest and in her back with coughing.  She also endorses intermittent wheezing.  Denies fever, headache, ear pain, shortness of breath, abdominal pain, nausea, vomiting, diarrhea, or rash.  States that she has been taking over-the-counter cough and cold medications for symptoms with minimal relief.  Denies prior history of smoking or asthma.  Past Medical History:  Diagnosis Date   Medical history non-contributory    Pregnant 11/17/2014   Trichimoniasis 05/23/2017   +on pap , treated 05/23/17    Patient Active Problem List   Diagnosis Date Noted   Obesity (BMI 30.0-34.9) 09/07/2020    Past Surgical History:  Procedure Laterality Date   NO PAST SURGERIES      OB History     Gravida  1   Para  1   Term  1   Preterm      AB      Living  1      SAB      IAB      Ectopic      Multiple  0   Live Births  1            Home Medications    Prior to Admission medications  Medication Sig Start Date End Date Taking? Authorizing Provider  albuterol  (VENTOLIN  HFA) 108 (90 Base) MCG/ACT inhaler Inhale 2 puffs into the lungs every 6 (six) hours as needed. 05/25/24  Yes Leath-Warren, Etta JINNY, NP  predniSONE  (DELTASONE ) 20 MG tablet Take 2 tablets (40 mg total) by mouth daily with breakfast for 5 days. 05/25/24 05/30/24 Yes Leath-Warren, Etta JINNY, NP  promethazine -dextromethorphan (PROMETHAZINE -DM) 6.25-15 MG/5ML syrup Take 5 mLs by mouth 4  (four) times daily as needed. 05/25/24  Yes Leath-Warren, Etta JINNY, NP  cholecalciferol (VITAMIN D3) 25 MCG (1000 UNIT) tablet Take 1,000 Units by mouth daily.    [provider]  hydrocortisone  (ANUSOL -HC) 25 MG suppository Place 1 suppository (25 mg total) rectally 2 (two) times daily. 04/08/24   Kizzie Suzen SAUNDERS, CNM    Family History Family History  Problem Relation Age of Onset   Bladder Cancer Maternal Grandmother     Social History Social History[1]   Allergies   Patient has no known allergies.   Review of Systems Review of Systems Per HPI  Physical Exam Triage Vital Signs ED Triage Vitals [05/25/24 0843]  Encounter Vitals Group     BP 133/67     Girls Systolic BP Percentile      Girls Diastolic BP Percentile      Boys Systolic BP Percentile      Boys Diastolic BP Percentile      Pulse Rate 95     Resp 18     Temp 98.9 F (37.2 C)     Temp Source Oral     SpO2 97 %     Weight  Height      Head Circumference      Peak Flow      Pain Score 7     Pain Loc      Pain Education      Exclude from Growth Chart    No data found.  Updated Vital Signs BP 133/67 (BP Location: Right Arm)   Pulse 95   Temp 98.9 F (37.2 C) (Oral)   Resp 18   LMP 04/27/2024 (Approximate)   SpO2 97%   Visual Acuity Right Eye Distance:   Left Eye Distance:   Bilateral Distance:    Right Eye Near:   Left Eye Near:    Bilateral Near:     Physical Exam Vitals and nursing note reviewed.  Constitutional:      General: She is not in acute distress.    Appearance: Normal appearance. She is well-developed.  HENT:     Head: Normocephalic and atraumatic.     Right Ear: Tympanic membrane, ear canal and external ear normal.     Left Ear: Tympanic membrane, ear canal and external ear normal.     Nose: Nose normal. No congestion.     Right Turbinates: Enlarged and swollen.     Left Turbinates: Enlarged and swollen.     Right Sinus: No maxillary sinus tenderness or  frontal sinus tenderness.     Left Sinus: No maxillary sinus tenderness or frontal sinus tenderness.     Mouth/Throat:     Lips: Pink.     Mouth: Mucous membranes are moist.     Pharynx: Uvula midline. Postnasal drip present. No pharyngeal swelling, oropharyngeal exudate, posterior oropharyngeal erythema or uvula swelling.     Comments: Cobblestoning present to posterior oropharynx  Eyes:     Extraocular Movements: Extraocular movements intact.     Conjunctiva/sclera: Conjunctivae normal.     Pupils: Pupils are equal, round, and reactive to light.  Neck:     Thyroid: No thyromegaly.     Trachea: No tracheal deviation.  Cardiovascular:     Rate and Rhythm: Normal rate and regular rhythm.     Pulses: Normal pulses.     Heart sounds: Normal heart sounds.  Pulmonary:     Effort: Pulmonary effort is normal. No respiratory distress.     Breath sounds: Normal breath sounds. No stridor. No wheezing, rhonchi or rales.  Abdominal:     General: Bowel sounds are normal.     Palpations: Abdomen is soft.     Tenderness: There is no abdominal tenderness.  Musculoskeletal:     Cervical back: Normal range of motion and neck supple.  Skin:    General: Skin is warm and dry.  Neurological:     General: No focal deficit present.     Mental Status: She is alert and oriented to person, place, and time.  Psychiatric:        Mood and Affect: Mood normal.        Behavior: Behavior normal.        Thought Content: Thought content normal.        Judgment: Judgment normal.      UC Treatments / Results  Labs (all labs ordered are listed, but only abnormal results are displayed) Labs Reviewed - No data to display  EKG   Radiology No results found.  Procedures Procedures (including critical care time)  Medications Ordered in UC Medications - No data to display  Initial Impression / Assessment and Plan / UC Course  I  have reviewed the triage vital signs and the nursing notes.  Pertinent  labs & imaging results that were available during my care of the patient were reviewed by me and considered in my medical decision making (see chart for details).  On exam, lung sounds are clear throughout, room air sats are at 97%.  Patient with a 2-week history of cough that has become dry and persistent.  She also complains of intermittent pain and intermittent wheezing in her chest with the cough.  Symptoms are consistent with acute bronchitis.  Will treat with prednisone  40 mg for bronchial inflammation, Promethazine  DM for the cough, and an albuterol  inhaler as needed for wheezing or shortness of breath.  Discussed indications with the patient regarding follow-up.  Patient was in agreement with this plan of care and verbalizes understanding.  All questions were answered.  Patient stable for discharge.  Final Clinical Impressions(s) / UC Diagnoses   Final diagnoses:  Acute bronchitis, unspecified organism     Discharge Instructions      Take medication as prescribed. Increase fluids and allow for plenty of rest. You may take over-the-counter Tylenol  or ibuprofen  as needed for pain, fever, or general discomfort. Recommend the use of a humidifier in your bedroom at nighttime during sleep and sleeping elevated on pillows while symptoms persist. As discussed, your cough may linger from days to weeks.  If you continue to have a persistent nagging cough, but are generally feeling well, continue over-the-counter cough medications, cough drops, and fluids.  Seek care if you develop worsening wheezing, develop shortness of breath, difficulty breathing, fever, or other concerns. Follow-up as needed.     ED Prescriptions     Medication Sig Dispense Auth. Provider   predniSONE  (DELTASONE ) 20 MG tablet Take 2 tablets (40 mg total) by mouth daily with breakfast for 5 days. 10 tablet Leath-Warren, Etta PARAS, NP   promethazine -dextromethorphan (PROMETHAZINE -DM) 6.25-15 MG/5ML syrup Take 5 mLs by  mouth 4 (four) times daily as needed. 118 mL Leath-Warren, Etta PARAS, NP   albuterol  (VENTOLIN  HFA) 108 (90 Base) MCG/ACT inhaler Inhale 2 puffs into the lungs every 6 (six) hours as needed. 8 g Leath-Warren, Etta PARAS, NP      PDMP not reviewed this encounter.     [1]  Social History Tobacco Use   Smoking status: Never   Smokeless tobacco: Never  Vaping Use   Vaping status: Never Used  Substance Use Topics   Alcohol use: Yes   Drug use: No     Gilmer Etta PARAS, NP 05/25/24 [phone]  "

## 2024-05-25 NOTE — ED Triage Notes (Signed)
 Cough x 2 weeks.  Feels pain in chest and back.  Has been taking tylenol  cold and flu.
